# Patient Record
Sex: Female | Born: 1975 | Race: White | Hispanic: No | Smoking: Former smoker
Health system: Southern US, Community
[De-identification: ages and names within clinical notes are randomized; demographics above are authoritative.]

## PROBLEM LIST (undated history)

## (undated) DIAGNOSIS — E282 Polycystic ovarian syndrome: Secondary | ICD-10-CM

## (undated) DIAGNOSIS — G510 Bell's palsy: Secondary | ICD-10-CM

## (undated) DIAGNOSIS — F419 Anxiety disorder, unspecified: Secondary | ICD-10-CM

## (undated) DIAGNOSIS — E669 Obesity, unspecified: Secondary | ICD-10-CM

## (undated) DIAGNOSIS — E119 Type 2 diabetes mellitus without complications: Secondary | ICD-10-CM

## (undated) DIAGNOSIS — I10 Essential (primary) hypertension: Secondary | ICD-10-CM

## (undated) DIAGNOSIS — D649 Anemia, unspecified: Secondary | ICD-10-CM

## (undated) DIAGNOSIS — K219 Gastro-esophageal reflux disease without esophagitis: Secondary | ICD-10-CM

## (undated) HISTORY — DX: Anemia, unspecified: D64.9

## (undated) HISTORY — DX: Essential (primary) hypertension: I10

## (undated) HISTORY — DX: Bell's palsy: G51.0

## (undated) HISTORY — DX: Anxiety disorder, unspecified: F41.9

## (undated) HISTORY — DX: Hypercalcemia: E83.52

## (undated) HISTORY — DX: Gastro-esophageal reflux disease without esophagitis: K21.9

## (undated) HISTORY — PX: UPPER GASTROINTESTINAL ENDOSCOPY: SHX188

## (undated) HISTORY — DX: Obesity, unspecified: E66.9

## (undated) HISTORY — PX: MOLE REMOVAL: SHX2046

---

## 2013-11-19 DIAGNOSIS — E282 Polycystic ovarian syndrome: Secondary | ICD-10-CM | POA: Insufficient documentation

## 2013-11-19 DIAGNOSIS — K219 Gastro-esophageal reflux disease without esophagitis: Secondary | ICD-10-CM | POA: Insufficient documentation

## 2013-11-19 DIAGNOSIS — L680 Hirsutism: Secondary | ICD-10-CM | POA: Insufficient documentation

## 2013-11-19 DIAGNOSIS — G4700 Insomnia, unspecified: Secondary | ICD-10-CM | POA: Insufficient documentation

## 2013-12-30 DIAGNOSIS — I10 Essential (primary) hypertension: Secondary | ICD-10-CM | POA: Insufficient documentation

## 2013-12-30 DIAGNOSIS — F411 Generalized anxiety disorder: Secondary | ICD-10-CM | POA: Insufficient documentation

## 2014-08-15 DIAGNOSIS — G4733 Obstructive sleep apnea (adult) (pediatric): Secondary | ICD-10-CM | POA: Insufficient documentation

## 2014-09-09 ENCOUNTER — Other Ambulatory Visit (HOSPITAL_COMMUNITY): Payer: Self-pay | Admitting: Gastroenterology

## 2014-09-09 DIAGNOSIS — R6881 Early satiety: Secondary | ICD-10-CM

## 2014-09-19 ENCOUNTER — Ambulatory Visit (HOSPITAL_COMMUNITY)
Admission: RE | Admit: 2014-09-19 | Discharge: 2014-09-19 | Disposition: A | Discharge status: Home / Self Care | Payer: BC Managed Care – PPO | Source: Ambulatory Visit | Attending: Gastroenterology | Admitting: Gastroenterology

## 2014-09-19 DIAGNOSIS — R6881 Early satiety: Secondary | ICD-10-CM | POA: Diagnosis present

## 2014-09-19 DIAGNOSIS — K219 Gastro-esophageal reflux disease without esophagitis: Secondary | ICD-10-CM | POA: Insufficient documentation

## 2014-09-19 DIAGNOSIS — R112 Nausea with vomiting, unspecified: Secondary | ICD-10-CM | POA: Diagnosis present

## 2014-09-19 MED ORDER — TECHNETIUM TC 99M SULFUR COLLOID
2.0000 | Freq: Once | INTRAVENOUS | Status: AC | PRN
  Administered 2014-09-19: 2 via INTRAVENOUS

## 2015-03-19 DIAGNOSIS — E1169 Type 2 diabetes mellitus with other specified complication: Secondary | ICD-10-CM | POA: Insufficient documentation

## 2015-03-19 DIAGNOSIS — E785 Hyperlipidemia, unspecified: Secondary | ICD-10-CM | POA: Insufficient documentation

## 2016-11-22 DIAGNOSIS — D729 Disorder of white blood cells, unspecified: Secondary | ICD-10-CM | POA: Insufficient documentation

## 2016-11-22 DIAGNOSIS — R809 Proteinuria, unspecified: Secondary | ICD-10-CM | POA: Insufficient documentation

## 2016-11-22 DIAGNOSIS — D75839 Thrombocytosis, unspecified: Secondary | ICD-10-CM | POA: Insufficient documentation

## 2016-11-22 DIAGNOSIS — D72829 Elevated white blood cell count, unspecified: Secondary | ICD-10-CM | POA: Insufficient documentation

## 2016-12-28 DIAGNOSIS — F17200 Nicotine dependence, unspecified, uncomplicated: Secondary | ICD-10-CM | POA: Insufficient documentation

## 2018-01-22 DIAGNOSIS — E538 Deficiency of other specified B group vitamins: Secondary | ICD-10-CM | POA: Insufficient documentation

## 2018-01-22 DIAGNOSIS — E559 Vitamin D deficiency, unspecified: Secondary | ICD-10-CM | POA: Insufficient documentation

## 2018-09-14 DIAGNOSIS — Z2821 Immunization not carried out because of patient refusal: Secondary | ICD-10-CM | POA: Insufficient documentation

## 2018-09-14 DIAGNOSIS — M62838 Other muscle spasm: Secondary | ICD-10-CM | POA: Insufficient documentation

## 2020-03-18 DIAGNOSIS — Z862 Personal history of diseases of the blood and blood-forming organs and certain disorders involving the immune mechanism: Secondary | ICD-10-CM | POA: Insufficient documentation

## 2021-07-08 ENCOUNTER — Other Ambulatory Visit: Payer: Self-pay

## 2021-07-08 ENCOUNTER — Emergency Department (INDEPENDENT_AMBULATORY_CARE_PROVIDER_SITE_OTHER)
Admission: RE | Admit: 2021-07-08 | Discharge: 2021-07-08 | Disposition: A | Discharge status: Home / Self Care | Payer: BC Managed Care – PPO | Source: Ambulatory Visit

## 2021-07-08 ENCOUNTER — Emergency Department (INDEPENDENT_AMBULATORY_CARE_PROVIDER_SITE_OTHER): Payer: BC Managed Care – PPO

## 2021-07-08 VITALS — BP 182/110 | HR 91 | Temp 98.8°F | Resp 16

## 2021-07-08 DIAGNOSIS — R1030 Lower abdominal pain, unspecified: Secondary | ICD-10-CM

## 2021-07-08 DIAGNOSIS — I10 Essential (primary) hypertension: Secondary | ICD-10-CM | POA: Diagnosis not present

## 2021-07-08 HISTORY — DX: Type 2 diabetes mellitus without complications: E11.9

## 2021-07-08 HISTORY — DX: Polycystic ovarian syndrome: E28.2

## 2021-07-08 NOTE — ED Provider Notes (Signed)
[facility]    CSN: [id number] Arrival date & time: 07/08/21  1230      History   Chief Complaint Chief Complaint  Patient presents with   Abdominal Pain    HPI Lindsey Maxwell is a 45 y.o. female.   HPI 45 year old female female patient presents with abdominal pain since this morning.  Reports abdominal pain lasted 10 minutes after having small bowel movement and was located bilaterally below belly button however this pain is now resolved.  Reports has been diagnosed with hypertension in the past and is currently not followed by PCP.  Past Medical History:  Diagnosis Date   Diabetes mellitus without complication (HCC)    PCOS (polycystic ovarian syndrome)     There are no problems to display for this patient.   History reviewed. No pertinent surgical history.  OB History   No obstetric history on file.      Home Medications    Prior to Admission medications   Medication Sig Start Date End Date Taking? Authorizing Provider  esomeprazole (NEXIUM) 20 MG capsule Take 20 mg by mouth daily at 12 noon.   Yes [provider]  Norgestimate-Eth Estradiol (SPRINTEC 28 PO) Take by mouth.   Yes [provider]    Family History Family History  Problem Relation Age of Onset   Cancer Mother        breast CA   Diabetes Mother    Hypertension Father    Heart attack Brother 33    Social History Social History   Tobacco Use   Smoking status: Former    Types: Cigarettes    Quit date: 06/22/2011    Years since quitting: 10.0  Vaping Use   Vaping Use: Never used  Substance Use Topics   Alcohol use: Yes   Drug use: Never     Allergies   Patient has no known allergies.   Review of Systems Review of Systems  Gastrointestinal:  Positive for abdominal pain.  All other systems reviewed and are negative.  Second manual BP left seated 182/110  Physical Exam Triage Vital Signs ED Triage Vitals  Enc Vitals Group     BP 07/08/21 1302 (!)  204/108     Pulse Rate 07/08/21 1302 91     Resp 07/08/21 1302 16     Temp 07/08/21 1302 98.8 F (37.1 C)     Temp Source 07/08/21 1302 Oral     SpO2 07/08/21 1302 97 %     Weight --      Height --      Head Circumference --      Peak Flow --      Pain Score 07/08/21 1305 0     Pain Loc --      Pain Edu? --      Excl. in [place]? --    No data found.  Updated Vital Signs BP (!) 182/110 (BP Location: Left Arm)   Pulse 91   Temp 98.8 F (37.1 C) (Oral)   Resp 16   SpO2 97%   Physical Exam Vitals and nursing note reviewed.  Constitutional:      General: She is not in acute distress.    Appearance: Normal appearance. She is obese. She is not ill-appearing.  HENT:     Head: Normocephalic and atraumatic.     Mouth/Throat:     Mouth: Mucous membranes are moist.     Pharynx: Oropharynx is clear.  Eyes:     Extraocular  Movements: Extraocular movements intact.     Conjunctiva/sclera: Conjunctivae normal.     Pupils: Pupils are equal, round, and reactive to light.  Cardiovascular:     Rate and Rhythm: Normal rate and regular rhythm.     Pulses: Normal pulses.     Heart sounds: Normal heart sounds. No murmur heard. Pulmonary:     Effort: Pulmonary effort is normal.     Breath sounds: Normal breath sounds. No wheezing, rhonchi or rales.  Abdominal:     General: There is no distension.     Palpations: Abdomen is soft. There is no mass.     Tenderness: There is no abdominal tenderness. There is no right CVA tenderness, left CVA tenderness, guarding or rebound.     Hernia: No hernia is present.     Comments: Hypoactive bowel sounds x4 quads, no hepatosplenomegaly  Musculoskeletal:        General: Normal range of motion.     Cervical back: Normal range of motion and neck supple. No tenderness.  Lymphadenopathy:     Cervical: No cervical adenopathy.  Skin:    General: Skin is warm and dry.  Neurological:     General: No focal deficit present.     Mental Status: She is alert and  oriented to person, place, and time. Mental status is at baseline.  Psychiatric:        Mood and Affect: Mood normal.        Behavior: Behavior normal.        Thought Content: Thought content normal.     UC Treatments / Results  Labs (all labs ordered are listed, but only abnormal results are displayed) Labs Reviewed  CBC WITH DIFFERENTIAL/PLATELET  COMPLETE METABOLIC PANEL WITH GFR    EKG   Radiology DG Abdomen 1 View  Result Date: 07/08/2021 CLINICAL DATA:  Lower abdominal pain EXAM: ABDOMEN - 1 VIEW COMPARISON:  None. FINDINGS: The bowel gas pattern is normal. No radio-opaque calculi or other significant radiographic abnormality are seen. IMPRESSION: Negative. Electronically Signed   By: Franchot Gallo M.D.   On: 07/08/2021 14:52    Procedures Procedures (including critical care time)  Medications Ordered in UC Medications - No data to display  Initial Impression / Assessment and Plan / UC Course  I have reviewed the triage vital signs and the nursing notes.  Pertinent labs & imaging results that were available during my care of the patient were reviewed by me and considered in my medical decision making (see chart for details).     MDM: 1.  Lower abdominal pain-KUB revealed no acute process, CBC with differential ordered; 2.  Hypertension-CMP ordered. Advised/instructed patient to take blood pressure first thing in the morning after voiding and prior to breakfast.  Advised patient to write numbers down for the next 7 to 10 days so that we or new PCP can evaluate daily blood pressure trends.  Advised patient we will follow-up with lab results once received.  Patient discharged home, hemodynamically stable.  Final Clinical Impressions(s) / UC Diagnoses   Final diagnoses:  Lower abdominal pain  Essential hypertension     Discharge Instructions      Advised/instructed patient to take blood pressure first thing in the morning after voiding and prior to breakfast.   Advised patient to write numbers down for the next 7 to 10 days so that we or new PCP can evaluate daily blood pressure trends.  Advised patient we will follow-up with lab results once received.  ED Prescriptions   None    PDMP not reviewed this encounter.   Eliezer Lofts, [place] 07/08/21 [phone]

## 2021-07-08 NOTE — Discharge Instructions (Addendum)
Advised/instructed patient to take blood pressure first thing in the morning after voiding and prior to breakfast.  Advised patient to write numbers down for the next 7 to 10 days so that we or new PCP can evaluate daily blood pressure trends.  Advised patient we will follow-up with lab results once received.

## 2021-07-08 NOTE — ED Triage Notes (Signed)
Patient c/o an episode of abdominal pain that lasted about 10 minutes after having a small bowel movement. Pain was lower and bilateral. Reports sweats and chills at the time. Pain has since resolved.

## 2021-07-09 ENCOUNTER — Telehealth: Payer: Self-pay | Admitting: Emergency Medicine

## 2021-07-09 LAB — CBC WITH DIFFERENTIAL/PLATELET
Absolute Monocytes: 475 cells/uL (ref 200–950)
Basophils Absolute: 75 cells/uL (ref 0–200)
Basophils Relative: 0.6 %
Eosinophils Absolute: 263 cells/uL (ref 15–500)
Eosinophils Relative: 2.1 %
HCT: 35.7 % (ref 35.0–45.0)
Hemoglobin: 11.3 g/dL — ABNORMAL LOW (ref 11.7–15.5)
Lymphs Abs: 1950 cells/uL (ref 850–3900)
MCH: 24.5 pg — ABNORMAL LOW (ref 27.0–33.0)
MCHC: 31.7 g/dL — ABNORMAL LOW (ref 32.0–36.0)
MCV: 77.3 fL — ABNORMAL LOW (ref 80.0–100.0)
MPV: 11 fL (ref 7.5–12.5)
Monocytes Relative: 3.8 %
Neutro Abs: 9738 cells/uL — ABNORMAL HIGH (ref 1500–7800)
Neutrophils Relative %: 77.9 %
Platelets: 502 10*3/uL — ABNORMAL HIGH (ref 140–400)
RBC: 4.62 10*6/uL (ref 3.80–5.10)
RDW: 14.4 % (ref 11.0–15.0)
Total Lymphocyte: 15.6 %
WBC: 12.5 10*3/uL — ABNORMAL HIGH (ref 3.8–10.8)

## 2021-07-09 LAB — COMPLETE METABOLIC PANEL WITH GFR
AG Ratio: 1.2 (calc) (ref 1.0–2.5)
ALT: 16 U/L (ref 6–29)
AST: 33 U/L — ABNORMAL HIGH (ref 10–30)
Albumin: 4.1 g/dL (ref 3.6–5.1)
Alkaline phosphatase (APISO): 130 U/L — ABNORMAL HIGH (ref 31–125)
BUN: 10 mg/dL (ref 7–25)
CO2: 25 mmol/L (ref 20–32)
Calcium: 9.4 mg/dL (ref 8.6–10.2)
Chloride: 94 mmol/L — ABNORMAL LOW (ref 98–110)
Creat: 0.74 mg/dL (ref 0.50–0.99)
Globulin: 3.5 g/dL (calc) (ref 1.9–3.7)
Glucose, Bld: 270 mg/dL — ABNORMAL HIGH (ref 65–99)
Potassium: 4.4 mmol/L (ref 3.5–5.3)
Sodium: 133 mmol/L — ABNORMAL LOW (ref 135–146)
Total Bilirubin: 0.3 mg/dL (ref 0.2–1.2)
Total Protein: 7.6 g/dL (ref 6.1–8.1)
eGFR: 103 mL/min/{1.73_m2} (ref >=60)

## 2021-07-09 MED ORDER — LOSARTAN POTASSIUM 50 MG PO TABS: 50.0000 mg | ORAL_TABLET | Freq: Every day | ORAL | 0 refills | Status: DC

## 2021-07-09 NOTE — Telephone Encounter (Signed)
Call to [facility] to update her on labs and to  let her know Dr Assunta Found was reviewing her chart. BP this morning was 192/104 on the left arm. Sarra states she is not on any BP medicine & is currently not taking anything for her diabetes. Pt states she was on diltiazem in the past for BP. Pt denies chest or abdominal pain. RN will call Jeanifer back later on today.

## 2021-07-09 NOTE — Telephone Encounter (Signed)
Call back to check on pharmacy for [facility] - pt does not have a PCP. Denies any headache at this time. Plan per Dr Assunta Found is for pt to start on losartan today, check BP daily, keep a record of BP & blood sugar at home & establish care with a PCP today. Lindsey Maxwell verbalized an understanding. Pharmacy confirmed. Lindsey Maxwell also verbalized an understanding that she should go to the ER immediately for severe abdominal pain, chest pain or headache that does not resolve or uncontrolled nausea and vomiting. RN also reviewed symptoms of an elevated blood sugar that would require Lindsey Maxwell to go to the ED: increased thirst, nocturia & increase in urination. Lindsey Maxwell verbalized an understanding.

## 2021-07-09 NOTE — Telephone Encounter (Signed)
Patient reports that her abdominal pain has resolved, but her BP remains elevated at home 192/104.  Note mildly elevated LFT's and mild leukocytosis (WBC 12.5). Will begin Losartan 50mg  daily (#30, no refill).  Recommend that she monitor BP and follow-up with PCP as soon as possible for further evaluation (presently does not have PCP). If symptoms become significantly worse during the night or over the weekend, advised to proceed to the local emergency room.

## 2021-08-16 ENCOUNTER — Ambulatory Visit (INDEPENDENT_AMBULATORY_CARE_PROVIDER_SITE_OTHER): Payer: BC Managed Care – PPO | Admitting: Medical-Surgical

## 2021-08-16 ENCOUNTER — Other Ambulatory Visit: Payer: Self-pay

## 2021-08-16 ENCOUNTER — Encounter: Payer: Self-pay | Admitting: Medical-Surgical

## 2021-08-16 VITALS — BP 166/89 | HR 86 | Temp 98.3°F | Ht 64.0 in | Wt 232.3 lb

## 2021-08-16 DIAGNOSIS — Z13220 Encounter for screening for lipoid disorders: Secondary | ICD-10-CM

## 2021-08-16 DIAGNOSIS — R300 Dysuria: Secondary | ICD-10-CM | POA: Diagnosis not present

## 2021-08-16 DIAGNOSIS — Z0000 Encounter for general adult medical examination without abnormal findings: Secondary | ICD-10-CM

## 2021-08-16 DIAGNOSIS — E1165 Type 2 diabetes mellitus with hyperglycemia: Secondary | ICD-10-CM

## 2021-08-16 DIAGNOSIS — E559 Vitamin D deficiency, unspecified: Secondary | ICD-10-CM

## 2021-08-16 DIAGNOSIS — E1169 Type 2 diabetes mellitus with other specified complication: Secondary | ICD-10-CM | POA: Diagnosis not present

## 2021-08-16 DIAGNOSIS — I10 Essential (primary) hypertension: Secondary | ICD-10-CM | POA: Diagnosis not present

## 2021-08-16 DIAGNOSIS — Z114 Encounter for screening for human immunodeficiency virus [HIV]: Secondary | ICD-10-CM

## 2021-08-16 DIAGNOSIS — E538 Deficiency of other specified B group vitamins: Secondary | ICD-10-CM

## 2021-08-16 DIAGNOSIS — Z8639 Personal history of other endocrine, nutritional and metabolic disease: Secondary | ICD-10-CM

## 2021-08-16 DIAGNOSIS — Z7689 Persons encountering health services in other specified circumstances: Secondary | ICD-10-CM | POA: Diagnosis not present

## 2021-08-16 DIAGNOSIS — E785 Hyperlipidemia, unspecified: Secondary | ICD-10-CM

## 2021-08-16 DIAGNOSIS — Z1329 Encounter for screening for other suspected endocrine disorder: Secondary | ICD-10-CM

## 2021-08-16 DIAGNOSIS — D509 Iron deficiency anemia, unspecified: Secondary | ICD-10-CM

## 2021-08-16 DIAGNOSIS — Z1159 Encounter for screening for other viral diseases: Secondary | ICD-10-CM

## 2021-08-16 LAB — POCT URINALYSIS DIP (CLINITEK)
Bilirubin, UA: NEGATIVE
Blood, UA: NEGATIVE
Glucose, UA: 250 mg/dL — AB
Nitrite, UA: NEGATIVE
Spec Grav, UA: 1.025 (ref 1.010–1.025)
Urobilinogen, UA: 0.2 E.U./dL
pH, UA: 5.5 (ref 5.0–8.0)

## 2021-08-16 LAB — POCT GLYCOSYLATED HEMOGLOBIN (HGB A1C): Hemoglobin A1C: 11 % — AB (ref 4.0–5.6)

## 2021-08-16 MED ORDER — NORGESTIMATE-ETH ESTRADIOL 0.25-35 MG-MCG PO TABS: 1.0000 | ORAL_TABLET | Freq: Every day | ORAL | 0 refills | Status: DC

## 2021-08-16 MED ORDER — ROSUVASTATIN CALCIUM 10 MG PO TABS: 10.0000 mg | ORAL_TABLET | Freq: Every day | ORAL | 3 refills | Status: DC

## 2021-08-16 MED ORDER — LOSARTAN POTASSIUM-HCTZ 100-12.5 MG PO TABS: 1.0000 | ORAL_TABLET | Freq: Every day | ORAL | 1 refills | Status: DC

## 2021-08-16 MED ORDER — ESOMEPRAZOLE MAGNESIUM 20 MG PO CPDR: 20.0000 mg | DELAYED_RELEASE_CAPSULE | Freq: Every day | ORAL | 1 refills | Status: DC

## 2021-08-16 MED ORDER — METFORMIN HCL ER 500 MG PO TB24: 500.0000 mg | ORAL_TABLET | Freq: Two times a day (BID) | ORAL | 0 refills | Status: DC

## 2021-08-16 NOTE — Progress Notes (Signed)
New Patient Office Visit  Subjective:  Patient ID: Lindsey Maxwell, female    DOB: Nov 27, 1975  Age: 45 y.o. MRN: [id number]  CC:  Chief Complaint  Patient presents with   Establish Care   Dysuria    Onset 3 days ago   Hypertension   Diabetes     HPI Lindsey Maxwell presents to establish care.  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  Diabetes-has recently been checking her sugars with a range of 198-389 fasting.  Taking metformin 500 mg daily, tolerating well although she did have a couple of weeks of GI upset when she for started.   Hypertension-was recently seen at urgent care and restarted on losartan 50 mg daily.  Taking medication as prescribed, tolerating well without side effects.  She does have this listed as an allergy however she is currently tolerating her dosing without difficulty.  Checking her blood pressures at home with ranges of 152-225/83-114.  Notes that she recently purchased a new blood pressure cuff that measures on the arm.  Following a low-sodium diet.  Not currently exercising.  Mood-does have a history of some situational anxiety.  Uses Xanax as needed but only takes a half tab.  She still has the same bottle that was prescribed in June 2021 and only uses this for severe anxiety very sparingly.  Would prefer not to be on a controller medicine and does not like to feel drugged up so does not use this very often.  GERD-taking omeprazole 1 to 2 capsules nightly.  Notes that she usually takes 1 but if she is already having issues with reflux throughout the day, she will go ahead and take 2 to prevent waking up with nausea.  Vitamin D deficiency-history of vitamin D deficiency and has not been checked in a couple of years.  Not currently taking supplementation.  Vitamin B12 deficiency-striae vitamin B-12 deficiency but not currently taking  supplementation.  History of iron deficiency-has not been checked in a couple of years.  On oral birth control to prevent periods due to PCOS.  Dysuria-started about 3 days ago with intermittent burning with urination.  Does not happen with each void but has started to get more frequent.  No hematuria, urgency, frequency, hesitancy, or foul odor noted.  Past Medical History:  Diagnosis Date   Diabetes mellitus without complication (HCC)    PCOS (polycystic ovarian syndrome)     History reviewed. No pertinent surgical history.  Family History  Problem Relation Age of Onset   Cancer Mother        breast CA   Diabetes Mother    Hypertension Father    Heart attack Brother 68    Social History   Socioeconomic History   Marital status: Divorced    Spouse name: Not on file   Number of children: Not on file   Years of education: Not on file   Highest education level: Not on file  Occupational History   Not on file  Tobacco Use   Smoking status: Former    Types: Cigarettes    Quit date: 06/22/2011    Years since quitting: 10.1   Smokeless tobacco: Never  Vaping Use   Vaping Use: Never used  Substance and Sexual Activity   Alcohol use: Yes    Comment: occasionally   Drug use: Never   Sexual activity: Yes  Birth control/protection: OCP  Other Topics Concern   Not on file  Social History Narrative   Not on file   Social Determinants of Health   Financial Resource Strain: Not on file  Food Insecurity: Not on file  Transportation Needs: Not on file  Physical Activity: Not on file  Stress: Not on file  Social Connections: Not on file  Intimate Partner Violence: Not on file    ROS Review of Systems  Constitutional:  Negative for chills, fatigue, fever and unexpected weight change.  HENT:  Negative for congestion, rhinorrhea, sinus pressure and sore throat.   Respiratory:  Negative for cough, chest tightness and shortness of breath.   Cardiovascular:  Negative for  chest pain, palpitations and leg swelling.  Gastrointestinal:  Negative for abdominal pain, constipation, diarrhea, nausea and vomiting.  Endocrine: Negative for cold intolerance and heat intolerance.  Genitourinary:  Positive for dysuria. Negative for frequency and urgency.  Skin:  Negative for rash and wound.  Neurological:  Positive for numbness (Occasional tingling in fingers). Negative for dizziness, light-headedness and headaches.  Hematological:  Does not bruise/bleed easily.  Psychiatric/Behavioral:  Negative for dysphoric mood, self-injury, sleep disturbance and suicidal ideas. The patient is nervous/anxious.    Objective:   Today's Vitals: BP (!) 166/89   Pulse 86   Temp 98.3 F (36.8 C)   Ht 5\' 4"  (1.626 m)   Wt 232 lb 4.8 oz (105.4 kg)   SpO2 98%   BMI 39.87 kg/m   Physical Exam Vitals and nursing note reviewed.  Constitutional:      General: She is not in acute distress.    Appearance: Normal appearance.  HENT:     Head: Normocephalic and atraumatic.  Cardiovascular:     Rate and Rhythm: Normal rate and regular rhythm.     Pulses: Normal pulses.     Heart sounds: Normal heart sounds. No murmur heard.   No friction rub. No gallop.  Pulmonary:     Effort: Pulmonary effort is normal. No respiratory distress.     Breath sounds: Normal breath sounds. No wheezing.  Skin:    General: Skin is warm and dry.  Neurological:     Mental Status: She is alert and oriented to person, place, and time.  Psychiatric:        Mood and Affect: Mood normal.        Behavior: Behavior normal.        Thought Content: Thought content normal.        Judgment: Judgment normal.    Assessment & Plan:   1. Encounter to establish care Reviewed available information and discussed care concerns with patient.   2. Uncontrolled type 2 diabetes mellitus with hyperglycemia, without long-term current use of insulin (HCC) POCT hemoglobin A1c 11%.  Checking CBC with differential, CMP, and  lipid panel today.  Increasing metformin to 500 mg XR twice daily with meals.  Starting Rybelsus 3 mg daily, 30 minutes before meal.  Recommend checking glucose at least 3 times weekly fasting with a goal of 130 or less. - CBC with Differential/Platelet - COMPLETE METABOLIC PANEL WITH GFR - Lipid panel - POCT HgB A1C  3. Hyperlipidemia associated with type 2 diabetes mellitus ([place]) Checking lipid panel today. - Lipid panel  4. Essential hypertension Blood pressure still not controlled.  Increasing losartan to 100 mg daily and adding hydrochlorothiazide 12.5 mg daily.  Recommend checking blood pressure at home with goal of 130/80 or less.  Advised patient to bring  her blood pressure monitor with her for a nurse visit in 2 weeks so we can verify accuracy.  Encourage low-sodium diet.  5. Hypochromic-microcytic anemia Adding pathology smear review to CBC. - Pathologist smear review  6. Vitamin D deficiency Checking vitamin D. - Vitamin D 1,25 dihydroxy  7. Vitamin B 12 deficiency Checking vitamin B12. - Vitamin B12  8. Screening for endocrine disorder Checking TSH. - TSH  9. History of iron deficiency Checking iron panel. - Fe+TIBC+Fer  10. Dysuria POCT urinalysis positive for small leukocytes, trace ketones, trace protein, and glucose.  Sending for culture.  We will hold off on treatment due to minimal symptoms at this point. - Urine Culture - POCT URINALYSIS DIP (CLINITEK)  11. Need for hepatitis C screening test 12. Screening for HIV (human immunodeficiency virus) Discussed many recommendations.  Patient is agreeable so adding to blood work today. - Hepatitis C antibody - HIV Antibody (routine testing w rflx)    Outpatient Encounter Medications as of 08/16/2021  Medication Sig   ALPRAZolam (XANAX) 0.5 MG tablet Take 0.5 mg by mouth daily as needed for anxiety.   esomeprazole (NEXIUM) 20 MG capsule Take 1 capsule (20 mg total) by mouth daily at 12 noon.    losartan-hydrochlorothiazide (HYZAAR) 100-12.5 MG tablet Take 1 tablet by mouth daily.   meloxicam (MOBIC) 15 MG tablet Take 15 mg by mouth daily as needed for pain.   metFORMIN (GLUCOPHAGE XR) 500 MG 24 hr tablet Take 1 tablet (500 mg total) by mouth 2 (two) times daily before a meal.   norgestimate-ethinyl estradiol (SPRINTEC 28) 0.25-35 MG-MCG tablet Take 1 tablet by mouth daily.   norgestrel-ethinyl estradiol (LO/OVRAL) 0.3-30 MG-MCG tablet Take 1 tablet by mouth daily.   rosuvastatin (CRESTOR) 10 MG tablet Take 1 tablet (10 mg total) by mouth daily.   [DISCONTINUED] esomeprazole (NEXIUM) 20 MG capsule Take 20 mg by mouth daily at 12 noon.   [DISCONTINUED] losartan (COZAAR) 50 MG tablet Take 1 tablet (50 mg total) by mouth daily.   [DISCONTINUED] Norgestimate-Eth Estradiol (SPRINTEC 28 PO) Take by mouth.   No facility-administered encounter medications on file as of 08/16/2021.    Follow-up: Return in about 2 weeks (around 08/30/2021) for nurse visit for BP check.   Clearnce Sorrel, DNP, APRN, FNP-BC [facility]

## 2021-08-16 NOTE — Patient Instructions (Signed)
Blood pressure- changing to Losartan with HCTZ 100-12.5mg  once daily  Diabetes- increase Metformin to twice daily (breakfast and dinner). Adding Rybelsus 3mg  daily, 30 minutes before a meal. Check sugars at least 3 times weekly fasting.   Cholesterol- Starting Crestor 10mg  daily.

## 2021-08-17 LAB — HIV ANTIBODY (ROUTINE TESTING W REFLEX): HIV 1&2 Ab, 4th Generation: NONREACTIVE

## 2021-08-17 LAB — HEPATITIS C ANTIBODY
Hepatitis C Ab: NONREACTIVE
SIGNAL TO CUT-OFF: 0.06 (ref <1.00)

## 2021-08-18 LAB — URINE CULTURE
MICRO NUMBER:: 12605292
SPECIMEN QUALITY:: ADEQUATE

## 2021-08-18 MED ORDER — CEPHALEXIN 500 MG PO CAPS: 500.0000 mg | ORAL_CAPSULE | Freq: Two times a day (BID) | ORAL | 0 refills | Status: DC

## 2021-08-18 NOTE — Addendum Note (Signed)
Addended bySamuel Bouche on: 08/18/2021 07:07 AM   Modules accepted: Orders

## 2021-08-19 LAB — CBC WITH DIFFERENTIAL/PLATELET
Absolute Monocytes: 402 cells/uL (ref 200–950)
Basophils Absolute: 62 cells/uL (ref 0–200)
Basophils Relative: 0.6 %
Eosinophils Absolute: 319 cells/uL (ref 15–500)
Eosinophils Relative: 3.1 %
HCT: 37.7 % (ref 35.0–45.0)
Hemoglobin: 11.8 g/dL (ref 11.7–15.5)
Lymphs Abs: 2081 cells/uL (ref 850–3900)
MCH: 24.5 pg — ABNORMAL LOW (ref 27.0–33.0)
MCHC: 31.3 g/dL — ABNORMAL LOW (ref 32.0–36.0)
MCV: 78.4 fL — ABNORMAL LOW (ref 80.0–100.0)
MPV: 10.4 fL (ref 7.5–12.5)
Monocytes Relative: 3.9 %
Neutro Abs: 7437 cells/uL (ref 1500–7800)
Neutrophils Relative %: 72.2 %
Platelets: 406 10*3/uL — ABNORMAL HIGH (ref 140–400)
RBC: 4.81 10*6/uL (ref 3.80–5.10)
RDW: 15.6 % — ABNORMAL HIGH (ref 11.0–15.0)
Total Lymphocyte: 20.2 %
WBC: 10.3 10*3/uL (ref 3.8–10.8)

## 2021-08-19 LAB — VITAMIN D 1,25 DIHYDROXY
Vitamin D 1, 25 (OH)2 Total: 36 pg/mL (ref 18–72)
Vitamin D2 1, 25 (OH)2: <8 pg/mL
Vitamin D3 1, 25 (OH)2: 36 pg/mL

## 2021-08-19 LAB — COMPLETE METABOLIC PANEL WITH GFR
AG Ratio: 1.2 (calc) (ref 1.0–2.5)
ALT: 33 U/L — ABNORMAL HIGH (ref 6–29)
AST: 63 U/L — ABNORMAL HIGH (ref 10–30)
Albumin: 4.2 g/dL (ref 3.6–5.1)
Alkaline phosphatase (APISO): 132 U/L — ABNORMAL HIGH (ref 31–125)
BUN: 11 mg/dL (ref 7–25)
CO2: 26 mmol/L (ref 20–32)
Calcium: 9.3 mg/dL (ref 8.6–10.2)
Chloride: 96 mmol/L — ABNORMAL LOW (ref 98–110)
Creat: 0.57 mg/dL (ref 0.50–0.99)
Globulin: 3.5 g/dL (calc) (ref 1.9–3.7)
Glucose, Bld: 211 mg/dL — ABNORMAL HIGH (ref 65–99)
Potassium: 4.5 mmol/L (ref 3.5–5.3)
Sodium: 134 mmol/L — ABNORMAL LOW (ref 135–146)
Total Bilirubin: 0.3 mg/dL (ref 0.2–1.2)
Total Protein: 7.7 g/dL (ref 6.1–8.1)
eGFR: 116 mL/min/{1.73_m2} (ref >=60)

## 2021-08-19 LAB — LIPID PANEL
Cholesterol: 274 mg/dL — ABNORMAL HIGH (ref <200)
HDL: 31 mg/dL — ABNORMAL LOW (ref >=50)
LDL Cholesterol (Calc): 192 mg/dL (calc) — ABNORMAL HIGH
Non-HDL Cholesterol (Calc): 243 mg/dL (calc) — ABNORMAL HIGH (ref <130)
Total CHOL/HDL Ratio: 8.8 (calc) — ABNORMAL HIGH (ref <5.0)
Triglycerides: 301 mg/dL — ABNORMAL HIGH (ref <150)

## 2021-08-19 LAB — IRON,TIBC AND FERRITIN PANEL
%SAT: 7 % (calc) — ABNORMAL LOW (ref 16–45)
Ferritin: 27 ng/mL (ref 16–232)
Iron: 36 ug/dL — ABNORMAL LOW (ref 40–190)
TIBC: 525 mcg/dL (calc) — ABNORMAL HIGH (ref 250–450)

## 2021-08-19 LAB — PATHOLOGIST SMEAR REVIEW

## 2021-08-19 LAB — VITAMIN B12: Vitamin B-12: 1154 pg/mL — ABNORMAL HIGH (ref 200–1100)

## 2021-08-19 LAB — TSH: TSH: 3.92 mIU/L

## 2021-08-31 ENCOUNTER — Other Ambulatory Visit: Payer: Self-pay

## 2021-08-31 ENCOUNTER — Ambulatory Visit (INDEPENDENT_AMBULATORY_CARE_PROVIDER_SITE_OTHER): Payer: BC Managed Care – PPO | Admitting: Medical-Surgical

## 2021-08-31 VITALS — BP 136/64 | HR 80

## 2021-08-31 DIAGNOSIS — I10 Essential (primary) hypertension: Secondary | ICD-10-CM | POA: Diagnosis not present

## 2021-08-31 NOTE — Progress Notes (Signed)
New Patient Office Visit  Subjective:  Patient ID: Lindsey Maxwell, female    DOB: 04-07-1976  Age: 45 y.o. MRN: [id number]  CC:  Chief Complaint  Patient presents with   Hypertension    HPI Drenda Sobecki presents for blood pressure check. She did start the increase of Losartan-HCTZ. Denies chest pains, shortness of breath or dizziness.   Past Medical History:  Diagnosis Date   Diabetes mellitus without complication (HCC)    PCOS (polycystic ovarian syndrome)     History reviewed. No pertinent surgical history.  Family History  Problem Relation Age of Onset   Cancer Mother        breast CA   Diabetes Mother    Hypertension Father    Heart attack Brother 13    Social History   Socioeconomic History   Marital status: Divorced    Spouse name: Not on file   Number of children: Not on file   Years of education: Not on file   Highest education level: Not on file  Occupational History   Not on file  Tobacco Use   Smoking status: Former    Types: Cigarettes    Quit date: 06/22/2011    Years since quitting: 10.2   Smokeless tobacco: Never  Vaping Use   Vaping Use: Never used  Substance and Sexual Activity   Alcohol use: Yes    Comment: occasionally   Drug use: Never   Sexual activity: Yes    Birth control/protection: OCP  Other Topics Concern   Not on file  Social History Narrative   Not on file   Social Determinants of Health   Financial Resource Strain: Not on file  Food Insecurity: Not on file  Transportation Needs: Not on file  Physical Activity: Not on file  Stress: Not on file  Social Connections: Not on file  Intimate Partner Violence: Not on file    ROS Review of Systems  Objective:   Today's Vitals: BP 136/64   Pulse 80   SpO2 98%   Physical Exam  Assessment & Plan:  HTN - Second check of blood pressure was within normal limits. Patient advised to continue current medications as directed. Follow up in 3 months with Joy.    Problem List  Items Addressed This Visit     Essential hypertension - Primary    Outpatient Encounter Medications as of 08/31/2021  Medication Sig   ALPRAZolam (XANAX) 0.5 MG tablet Take 0.5 mg by mouth daily as needed for anxiety.   cephALEXin (KEFLEX) 500 MG capsule Take 1 capsule (500 mg total) by mouth 2 (two) times daily.   esomeprazole (NEXIUM) 20 MG capsule Take 1 capsule (20 mg total) by mouth daily at 12 noon.   losartan-hydrochlorothiazide (HYZAAR) 100-12.5 MG tablet Take 1 tablet by mouth daily.   meloxicam (MOBIC) 15 MG tablet Take 15 mg by mouth daily as needed for pain.   metFORMIN (GLUCOPHAGE XR) 500 MG 24 hr tablet Take 1 tablet (500 mg total) by mouth 2 (two) times daily before a meal.   norgestimate-ethinyl estradiol (SPRINTEC 28) 0.25-35 MG-MCG tablet Take 1 tablet by mouth daily.   norgestrel-ethinyl estradiol (LO/OVRAL) 0.3-30 MG-MCG tablet Take 1 tablet by mouth daily.   rosuvastatin (CRESTOR) 10 MG tablet Take 1 tablet (10 mg total) by mouth daily.   No facility-administered encounter medications on file as of 08/31/2021.    Follow-up: Return in about 3 months (around 12/01/2021) for HTN with [name], NP. Durene Romans,  Monico Blitz, CMA

## 2021-09-07 LAB — HM PAP SMEAR: HM Pap smear: NEGATIVE

## 2021-09-20 ENCOUNTER — Encounter: Payer: Self-pay | Admitting: Medical-Surgical

## 2021-09-23 ENCOUNTER — Other Ambulatory Visit: Payer: Self-pay

## 2021-09-23 ENCOUNTER — Encounter: Payer: Self-pay | Admitting: Family Medicine

## 2021-09-23 ENCOUNTER — Ambulatory Visit (INDEPENDENT_AMBULATORY_CARE_PROVIDER_SITE_OTHER): Payer: BC Managed Care – PPO | Admitting: Family Medicine

## 2021-09-23 VITALS — BP 167/104 | HR 98 | Temp 98.6°F | Ht 65.0 in | Wt 237.0 lb

## 2021-09-23 DIAGNOSIS — Z0000 Encounter for general adult medical examination without abnormal findings: Secondary | ICD-10-CM

## 2021-09-23 DIAGNOSIS — I10 Essential (primary) hypertension: Secondary | ICD-10-CM

## 2021-09-23 NOTE — Progress Notes (Signed)
BP (!) 167/104 (BP Location: Left Arm, Patient Position: Sitting, Cuff Size: Large)    Pulse 98    Temp 98.6 F (37 C) (Oral)    Ht $R'5\' 5"'IX$  (1.651 m)    Wt 237 lb (107.5 kg)    SpO2 97%    BMI 39.44 kg/m    Subjective:    Patient ID: Lindsey Maxwell, female    DOB: 1975/12/16, 45 y.o.   MRN: [id number]  HPI: Lindsey Maxwell is a 45 y.o. female presenting on 09/23/2021 for comprehensive medical examination. Current medical complaints include: none  She currently lives with: alone  Interim Problems from her last visit: no   She reports regular vision exams q1-5y: no; no vision problems  She reports regular dental exams q 69m: no Her diet consists of: regular, trying to try a low carb, Mediterranean diet She endorses exercise and/or activity of: walking  She works at: [employer]   She endorses ETOH use. - 0-3 servings per month She denies nictoine use. She denies illegal substance use.    She reports PCOS - on oral contraceptives.  Current menopausal symptoms: no She is currently  sexually active with  female  partner. She denies  concerns today about STI Contraception choices are: OCP  She denies concerns about skin changes today. She denies concerns about bowel changes today. She denies concerns about bladder changes today.   Depression Screen done today and results listed below:  Depression screen [facility] 2/9 09/23/2021 09/23/2021  Decreased Interest 0 0  Down, Depressed, Hopeless 0 0  PHQ - 2 Score 0 0       She does not have a history of falls.        Past Medical History:  Past Medical History:  Diagnosis Date   Diabetes mellitus without complication (HCC)    PCOS (polycystic ovarian syndrome)     Surgical History:  History reviewed. No pertinent surgical history.  Medications:  Current Outpatient Medications on File Prior to Visit  Medication Sig   ALPRAZolam (XANAX) 0.5 MG tablet Take 0.5 mg by mouth daily as needed for anxiety.   esomeprazole (NEXIUM) 20 MG  capsule Take 1 capsule (20 mg total) by mouth daily at 12 noon.   losartan-hydrochlorothiazide (HYZAAR) 100-12.5 MG tablet Take 1 tablet by mouth daily.   meloxicam (MOBIC) 15 MG tablet Take 15 mg by mouth daily as needed for pain.   metFORMIN (GLUCOPHAGE XR) 500 MG 24 hr tablet Take 1 tablet (500 mg total) by mouth 2 (two) times daily before a meal.   norgestimate-ethinyl estradiol (SPRINTEC 28) 0.25-35 MG-MCG tablet Take 1 tablet by mouth daily.   norgestrel-ethinyl estradiol (LO/OVRAL) 0.3-30 MG-MCG tablet Take 1 tablet by mouth daily.   rosuvastatin (CRESTOR) 10 MG tablet Take 1 tablet (10 mg total) by mouth daily.   cephALEXin (KEFLEX) 500 MG capsule Take 1 capsule (500 mg total) by mouth 2 (two) times daily. (Patient not taking: Reported on 09/23/2021)   No current facility-administered medications on file prior to visit.    Allergies:  Allergies  Allergen Reactions   Sitagliptin Swelling   Amlodipine Nausea And Vomiting    Headaches    Dapagliflozin    Losartan     Dizziness, burning sensation   Metformin Nausea And Vomiting   Metoprolol Nausea And Vomiting    headache   Venlafaxine     Swets, chills, shaky   Atorvastatin Diarrhea   Clonidine Cough    Dry throat  Social History:  Social History   Socioeconomic History   Marital status: Divorced    Spouse name: Not on file   Number of children: Not on file   Years of education: Not on file   Highest education level: Not on file  Occupational History   Not on file  Tobacco Use   Smoking status: Former    Types: Cigarettes    Quit date: 06/22/2011    Years since quitting: 10.2   Smokeless tobacco: Never  Vaping Use   Vaping Use: Never used  Substance and Sexual Activity   Alcohol use: Yes    Comment: occasionally   Drug use: Never   Sexual activity: Yes    Birth control/protection: OCP  Other Topics Concern   Not on file  Social History Narrative   Not on file   Social Determinants of Health    Financial Resource Strain: Not on file  Food Insecurity: Not on file  Transportation Needs: Not on file  Physical Activity: Not on file  Stress: Not on file  Social Connections: Not on file  Intimate Partner Violence: Not on file   Social History   Tobacco Use  Smoking Status Former   Types: Cigarettes   Quit date: 06/22/2011   Years since quitting: 10.2  Smokeless Tobacco Never   Social History   Substance and Sexual Activity  Alcohol Use Yes   Comment: occasionally    Family History:  Family History  Problem Relation Age of Onset   Cancer Mother        breast CA   Diabetes Mother    Hypertension Father    Heart attack Brother 61    Past medical history, surgical history, medications, allergies, family history and social history reviewed with patient today and changes made to appropriate areas of the chart.   All ROS negative except what is listed above and in the HPI.      Objective:    BP (!) 167/104 (BP Location: Left Arm, Patient Position: Sitting, Cuff Size: Large)    Pulse 98    Temp 98.6 F (37 C) (Oral)    Ht $R'5\' 5"'RC$  (1.651 m)    Wt 237 lb (107.5 kg)    SpO2 97%    BMI 39.44 kg/m   Wt Readings from Last 3 Encounters:  09/23/21 237 lb (107.5 kg)  08/16/21 232 lb 4.8 oz (105.4 kg)    Physical Exam Vitals reviewed.  Constitutional:      Appearance: Normal appearance. She is obese.  HENT:     Head: Normocephalic and atraumatic.     Nose: Nose normal.     Mouth/Throat:     Mouth: Mucous membranes are moist.     Pharynx: Oropharynx is clear.  Eyes:     Extraocular Movements: Extraocular movements intact.     Conjunctiva/sclera: Conjunctivae normal.     Pupils: Pupils are equal, round, and reactive to light.  Cardiovascular:     Rate and Rhythm: Normal rate and regular rhythm.     Pulses: Normal pulses.     Heart sounds: Normal heart sounds.  Pulmonary:     Effort: Pulmonary effort is normal.     Breath sounds: Normal breath sounds.  Abdominal:      General: Abdomen is flat. There is no distension.     Palpations: Abdomen is soft. There is no mass.     Tenderness: There is no abdominal tenderness. There is no right CVA tenderness, left CVA tenderness, guarding  or rebound.     Hernia: No hernia is present.  Musculoskeletal:        General: Normal range of motion.     Cervical back: Normal range of motion and neck supple.  Skin:    General: Skin is warm and dry.  Neurological:     General: No focal deficit present.     Mental Status: She is alert and oriented to person, place, and time. Mental status is at baseline.  Psychiatric:        Mood and Affect: Mood normal.        Behavior: Behavior normal.        Thought Content: Thought content normal.        Judgment: Judgment normal.     Results for orders placed or performed in visit on 08/16/21  Urine Culture   Specimen: Urine  Result Value Ref Range   MICRO NUMBER: [id number]    SPECIMEN QUALITY: Adequate    Sample Source URINE    STATUS: FINAL    ISOLATE 1: Streptococcus agalactiae (A)   CBC with Differential/Platelet  Result Value Ref Range   WBC 10.3 3.8 - 10.8 Thousand/uL   RBC 4.81 3.80 - 5.10 Million/uL   Hemoglobin 11.8 11.7 - 15.5 g/dL   HCT 37.7 35.0 - 45.0 %   MCV 78.4 (L) 80.0 - 100.0 fL   MCH 24.5 (L) 27.0 - 33.0 pg   MCHC 31.3 (L) 32.0 - 36.0 g/dL   RDW 15.6 (H) 11.0 - 15.0 %   Platelets 406 (H) 140 - 400 Thousand/uL   MPV 10.4 7.5 - 12.5 fL   Neutro Abs 7,437 1,500 - 7,800 cells/uL   Lymphs Abs 2,081 850 - 3,900 cells/uL   Absolute Monocytes 402 200 - 950 cells/uL   Eosinophils Absolute 319 15 - 500 cells/uL   Basophils Absolute 62 0 - 200 cells/uL   Neutrophils Relative % 72.2 %   Total Lymphocyte 20.2 %   Monocytes Relative 3.9 %   Eosinophils Relative 3.1 %   Basophils Relative 0.6 %  COMPLETE METABOLIC PANEL WITH GFR  Result Value Ref Range   Glucose, Bld 211 (H) 65 - 99 mg/dL   BUN 11 7 - 25 mg/dL   Creat 0.57 0.50 - 0.99 mg/dL   eGFR  116 > OR = 60 mL/min/1.89m2   BUN/Creatinine Ratio NOT APPLICABLE 6 - 22 (calc)   Sodium 134 (L) 135 - 146 mmol/L   Potassium 4.5 3.5 - 5.3 mmol/L   Chloride 96 (L) 98 - 110 mmol/L   CO2 26 20 - 32 mmol/L   Calcium 9.3 8.6 - 10.2 mg/dL   Total Protein 7.7 6.1 - 8.1 g/dL   Albumin 4.2 3.6 - 5.1 g/dL   Globulin 3.5 1.9 - 3.7 g/dL (calc)   AG Ratio 1.2 1.0 - 2.5 (calc)   Total Bilirubin 0.3 0.2 - 1.2 mg/dL   Alkaline phosphatase (APISO) 132 (H) 31 - 125 U/L   AST 63 (H) 10 - 30 U/L   ALT 33 (H) 6 - 29 U/L  Lipid panel  Result Value Ref Range   Cholesterol 274 (H) <200 mg/dL   HDL 31 (L) > OR = 50 mg/dL   Triglycerides 301 (H) <150 mg/dL   LDL Cholesterol (Calc) 192 (H) mg/dL (calc)   Total CHOL/HDL Ratio 8.8 (H) <5.0 (calc)   Non-HDL Cholesterol (Calc) 243 (H) <130 mg/dL (calc)  TSH  Result Value Ref Range   TSH 3.92 mIU/L  Pathologist  smear review  Result Value Ref Range   Path Review    Vitamin B12  Result Value Ref Range   Vitamin B-12 1,154 (H) 200 - 1,100 pg/mL  Vitamin D 1,25 dihydroxy  Result Value Ref Range   Vitamin D 1, 25 (OH)2 Total 36 18 - 72 pg/mL   Vitamin D3 1, 25 (OH)2 36 pg/mL   Vitamin D2 1, 25 (OH)2 <8 pg/mL  Fe+TIBC+Fer  Result Value Ref Range   Iron 36 (L) 40 - 190 mcg/dL   TIBC 525 (H) 250 - 450 mcg/dL (calc)   %SAT 7 (L) 16 - 45 % (calc)   Ferritin 27 16 - 232 ng/mL  HIV Antibody (routine testing w rflx)  Result Value Ref Range   HIV 1&2 Ab, 4th Generation NON-REACTIVE NON-REACTIVE  Hepatitis C antibody  Result Value Ref Range   Hepatitis C Ab NON-REACTIVE NON-REACTIVE   SIGNAL TO CUT-OFF 0.06 <1.00  POCT HgB A1C  Result Value Ref Range   Hemoglobin A1C 11.0 (A) 4.0 - 5.6 %   HbA1c POC (<> result, manual entry)     HbA1c, POC (prediabetic range)     HbA1c, POC (controlled diabetic range)    POCT URINALYSIS DIP (CLINITEK)  Result Value Ref Range   Color, UA yellow yellow   Clarity, UA clear clear   Glucose, UA =250 (A) negative mg/dL    Bilirubin, UA negative negative   Ketones, POC UA trace (5) (A) negative mg/dL   Spec Grav, UA 1.025 1.010 - 1.025   Blood, UA negative negative   pH, UA 5.5 5.0 - 8.0   POC PROTEIN,UA trace negative, trace   Urobilinogen, UA 0.2 0.2 or 1.0 E.U./dL   Nitrite, UA Negative Negative   Leukocytes, UA Small (1+) (A) Negative      Assessment & Plan:   Problem List Items Addressed This Visit       Cardiovascular and Mediastinum   Essential hypertension   Other Visit Diagnoses     Annual physical exam    -  Primary   Relevant Orders   CBC with Differential   COMPLETE METABOLIC PANEL WITH GFR       BP is elevated today. States it is usually high at appointments but better at home. Given significant elevation x2 checks, would like her to monitor at home and bring in home cuff in 2 weeks for BP check at nurse visit.    LABORATORY TESTING:  - Health maintenance labs ordered today as discussed above.    - labs done last month, rechecking CBC, CMP today  - STI testing: deferred - Pap smear: done elsewhere, this year   IMMUNIZATIONS:   - Tdap: Tetanus vaccination status reviewed: last tetanus booster within 10 years. - Influenza: Refused - Pneumovax: Not applicable - Prevnar: Refused - HPV: Not applicable - Shingrix vaccine: Not applicable - [id number]: Refused  SCREENING: - Mammogram: Not applicable - Bone Density: Not applicable - Colonoscopy: Not applicable  - AAA Screening: Not applicable  -Hearing Test: Not applicable  -Spirometry: Not applicable  - Lung Cancer Screening: Not applicable    PATIENT COUNSELING:   Advised to take 1 mg of folate supplement per day if capable of pregnancy.   Sexuality: Discussed sexually transmitted diseases, partner selection, use of condoms, avoidance of unintended pregnancy, and contraceptive alternatives.    I discussed with the patient that most people either abstain from alcohol or drink within safe limits (<=14/week and <=4  drinks/occasion for males, <=7/weeks and <=  3 drinks/occasion for females) and that the risk for alcohol disorders and other health effects rises proportionally with the number of drinks per week and how often a drinker exceeds daily limits.  Discussed cessation/primary prevention of drug use and availability of treatment for abuse.   Diet: Encouraged to adjust caloric intake to maintain or achieve ideal body weight, to reduce intake of dietary saturated fat and total fat, to limit sodium intake by avoiding high sodium foods and not adding table salt, and to maintain adequate dietary potassium and calcium preferably from fresh fruits, vegetables, and low-fat dairy products. Encouraged vitamin D 1000 units and Calcium $RemoveBef'1300mg'fnUjdDsyrx$  or 4 servings of dairy a day.  Emphasized the importance of regular exercise.  Injury prevention: Discussed safety belts, safety helmets, smoke detector, smoking near bedding or upholstery.   Dental health: Discussed importance of regular tooth brushing, flossing, and dental visits.  Follow up plan:  Return in about 2 weeks (around 10/07/2021) for nurse visit BP check .  Purcell Nails Olevia Bowens, DNP, FNP-C

## 2021-09-24 LAB — COMPLETE METABOLIC PANEL WITH GFR
AG Ratio: 1.2 (calc) (ref 1.0–2.5)
ALT: 11 U/L (ref 6–29)
AST: 23 U/L (ref 10–30)
Albumin: 4 g/dL (ref 3.6–5.1)
Alkaline phosphatase (APISO): 128 U/L — ABNORMAL HIGH (ref 31–125)
BUN: 12 mg/dL (ref 7–25)
CO2: 26 mmol/L (ref 20–32)
Calcium: 9.3 mg/dL (ref 8.6–10.2)
Chloride: 95 mmol/L — ABNORMAL LOW (ref 98–110)
Creat: 0.82 mg/dL (ref 0.50–0.99)
Globulin: 3.4 g/dL (calc) (ref 1.9–3.7)
Glucose, Bld: 374 mg/dL — ABNORMAL HIGH (ref 65–99)
Potassium: 4.6 mmol/L (ref 3.5–5.3)
Sodium: 134 mmol/L — ABNORMAL LOW (ref 135–146)
Total Bilirubin: 0.3 mg/dL (ref 0.2–1.2)
Total Protein: 7.4 g/dL (ref 6.1–8.1)
eGFR: 91 mL/min/{1.73_m2} (ref >=60)

## 2021-09-24 LAB — CBC WITH DIFFERENTIAL/PLATELET
Absolute Monocytes: 531 cells/uL (ref 200–950)
Basophils Absolute: 68 cells/uL (ref 0–200)
Basophils Relative: 0.6 %
Eosinophils Absolute: 260 cells/uL (ref 15–500)
Eosinophils Relative: 2.3 %
HCT: 34.5 % — ABNORMAL LOW (ref 35.0–45.0)
Hemoglobin: 10.8 g/dL — ABNORMAL LOW (ref 11.7–15.5)
Lymphs Abs: 2317 cells/uL (ref 850–3900)
MCH: 25.1 pg — ABNORMAL LOW (ref 27.0–33.0)
MCHC: 31.3 g/dL — ABNORMAL LOW (ref 32.0–36.0)
MCV: 80.2 fL (ref 80.0–100.0)
MPV: 11.3 fL (ref 7.5–12.5)
Monocytes Relative: 4.7 %
Neutro Abs: 8125 cells/uL — ABNORMAL HIGH (ref 1500–7800)
Neutrophils Relative %: 71.9 %
Platelets: 436 10*3/uL — ABNORMAL HIGH (ref 140–400)
RBC: 4.3 10*6/uL (ref 3.80–5.10)
RDW: 16.1 % — ABNORMAL HIGH (ref 11.0–15.0)
Total Lymphocyte: 20.5 %
WBC: 11.3 10*3/uL — ABNORMAL HIGH (ref 3.8–10.8)

## 2021-10-08 ENCOUNTER — Ambulatory Visit: Payer: BC Managed Care – PPO

## 2021-10-15 ENCOUNTER — Other Ambulatory Visit: Payer: Self-pay

## 2021-10-15 ENCOUNTER — Ambulatory Visit (INDEPENDENT_AMBULATORY_CARE_PROVIDER_SITE_OTHER): Payer: BC Managed Care – PPO | Admitting: Medical-Surgical

## 2021-10-15 VITALS — BP 142/75 | HR 91

## 2021-10-15 DIAGNOSIS — I10 Essential (primary) hypertension: Secondary | ICD-10-CM

## 2021-10-15 NOTE — Progress Notes (Signed)
Patient presents today as a nurse visit for a blood pressure check.  Patient states she is taking her medication as prescribed without any side effects/adverse effects. Medication and allergy list reviewed with patient and the pharmacy has been verified.   She has been checking her BP at home with her lowest reading being 122/72 and her highest reading being 170/79.   HA: No Dizziness/lightheadedness: No Fever: No BA: No Weakness/Fatigue: No  Sinus pain/pressure: No  Runny nose: No  ST: No  ShOB: No  CP: No  Palps: No Abd pain: No Dysuria: No  N/V/C/D: No    Vital Signs at 3:14 PM with her monitor Blood Pressure: 192/108 Pulse: 104  Vital Signs at 3:17 PM Blood Pressure: 167/77 Pulse: 96 SpO2: 99%  Vital Signs at 3:29 PM Blood Pressure: 142/75 Pulse: 91 SpO2: 100%   Information shared with [name], FNP who instructed me to tell pt to increase the hctz part of the losartan-hctz 100 mg-25 mg, continue to monitor BP at home and keep a log, and to schedule a f/u NV in 2 weeks. Pt states she does not want to increase the hctz because she is already getting up to urinate 5-6 times at night. I spoke with Caryl Asp again who said to make sure that the pt is taking her medicine in the morning, and if not, she needs to. If she is already taking it in the morning then she can continue to take what she is currently on and make lifestyle changes, or she can be changed to a totally different medication that we do not know if she will be able to tolerate well. Pt informed and she said that she takes the med at night but will start taking it in the morning and implement lifestyle changes. Pt also instructed to purchase a new BP monitor because there was such a large difference in the blood pressure reading comparison.

## 2021-10-29 ENCOUNTER — Ambulatory Visit: Payer: BC Managed Care – PPO

## 2021-11-09 ENCOUNTER — Other Ambulatory Visit: Payer: Self-pay | Admitting: Medical-Surgical

## 2021-12-01 ENCOUNTER — Encounter: Payer: Self-pay | Admitting: Medical-Surgical

## 2021-12-01 ENCOUNTER — Other Ambulatory Visit: Payer: Self-pay

## 2021-12-01 ENCOUNTER — Ambulatory Visit (INDEPENDENT_AMBULATORY_CARE_PROVIDER_SITE_OTHER): Payer: BC Managed Care – PPO | Admitting: Medical-Surgical

## 2021-12-01 VITALS — BP 143/82 | HR 91 | Resp 20 | Ht 65.0 in | Wt 230.6 lb

## 2021-12-01 DIAGNOSIS — E1169 Type 2 diabetes mellitus with other specified complication: Secondary | ICD-10-CM | POA: Diagnosis not present

## 2021-12-01 DIAGNOSIS — K219 Gastro-esophageal reflux disease without esophagitis: Secondary | ICD-10-CM | POA: Diagnosis not present

## 2021-12-01 DIAGNOSIS — E1165 Type 2 diabetes mellitus with hyperglycemia: Secondary | ICD-10-CM | POA: Diagnosis not present

## 2021-12-01 DIAGNOSIS — I10 Essential (primary) hypertension: Secondary | ICD-10-CM | POA: Diagnosis not present

## 2021-12-01 DIAGNOSIS — J0100 Acute maxillary sinusitis, unspecified: Secondary | ICD-10-CM

## 2021-12-01 DIAGNOSIS — F411 Generalized anxiety disorder: Secondary | ICD-10-CM

## 2021-12-01 DIAGNOSIS — E785 Hyperlipidemia, unspecified: Secondary | ICD-10-CM

## 2021-12-01 LAB — POCT GLYCOSYLATED HEMOGLOBIN (HGB A1C): HbA1c, POC (controlled diabetic range): 12.1 % — AB (ref 0.0–7.0)

## 2021-12-01 MED ORDER — RYBELSUS 3 MG PO TABS: 3.0000 mg | ORAL_TABLET | Freq: Every day | ORAL | 0 refills | Status: DC

## 2021-12-01 MED ORDER — LOSARTAN POTASSIUM-HCTZ 100-25 MG PO TABS: 1.0000 | ORAL_TABLET | Freq: Every day | ORAL | 1 refills | Status: DC

## 2021-12-01 MED ORDER — AZITHROMYCIN 250 MG PO TABS: ORAL_TABLET | ORAL | 0 refills | Status: AC

## 2021-12-01 NOTE — Progress Notes (Signed)
HPI with pertinent ROS:   CC: 74-month follow-up  HPI: Pleasant 46 year old female presenting today for the following:  Diabetes-not regularly checking sugars at home due to fear of needles.  Taking metformin 500 mg twice daily although she is having some GI issues.  Her last A1c was 11%.  Hypertension-not checking blood pressure regularly at home.  She does have periods during the day where she feels like her blood pressure may be up because her cheeks get red but this is transient.  Taking losartan-HCTZ 100-12.5 mg daily, tolerating well without side effects. .nohtn  Hyperlipidemia-taking rosuvastatin 10 mg daily, tolerating well without side effects.  GERD-continues to have some intermittent issues however is doing fairly well with esomeprazole 20 mg daily.  Mood-has been struggling quite a bit lately due to multiple health concerns.  Has been on hormonal therapy recently for irregular menses and reports this has been playing havoc on her mood as well as her overall wellbeing.  Is now having hot flashes since she is coming off the hormones.  Also notes that she has a lot going on in her life and has had multiple changes which are negatively affecting her.  Denies SI/HI.  I reviewed the past medical history, family history, social history, surgical history, and allergies today and no changes were needed.  Please see the problem list section below in epic for further details.   Physical exam:   General: Well Developed, well nourished, and in no acute distress.  Neuro: Alert and oriented x3.  HEENT: Normocephalic, atraumatic.  Skin: Warm and dry. Cardiac: Regular rate and rhythm, no murmurs rubs or gallops, no lower extremity edema.  Respiratory: Clear to auscultation bilaterally. Not using accessory muscles, speaking in full sentences.  Impression and Recommendations:    1. Essential hypertension Blood pressure not at goal even on recheck.  Switching blood pressure medications to  losartan-HCTZ 100-25 mg daily.  Advised her to keep a log at home of daily blood pressures with a goal of 130/80 or less.  Return in 2 weeks for nurse visit for blood pressure check.  Checking labs today.  2. Hyperlipidemia associated with type 2 diabetes mellitus (HCC) Continue rosuvastatin 10 mg daily.  Checking lipids today.  3. Gastroesophageal reflux disease without esophagitis Continue as omeprazole 20 mg daily.  Avoid food triggers.  4. Anxiety state It does appear that she has a lot going on right now especially in regards to the hormonal therapy and aftereffects.  She does have Xanax that was prescribed back in November that she can use very sparingly.  If she would like to start something as a maintenance medication, she is welcome to reach out via MyChart or phone and we can discuss this.  5. Uncontrolled type 2 diabetes mellitus with hyperglycemia, without long-term current use of insulin (HCC) POCT hemoglobin A1c 12.1% today up from 11%.  Unfortunately she has not responded well to the increase in metformin.  It is tolerable so she will continue metformin XR 500 mg twice daily before meals.  We are adding Rybelsus 3 mg daily to see if this will be covered by insurance and affordable rate.  If not, we will need to look into other options.  Advised her to go online or call her insurance company to discuss what is covered for type 2 diabetes that will be affordable for her.  She is welcome to reach out without information and weekend determine the next option for her should Rybelsus felt to be covered.  Referring to optometry for diabetic eye exam today.  Return in about 3 months (around 02/28/2022) for DM/HTN/HLD follow up. ___________________________________________ Clearnce Sorrel, DNP, APRN, FNP-BC [facility] [place]

## 2021-12-02 NOTE — Progress Notes (Signed)
Order number: [id number] labs added through [facility] portal.

## 2021-12-03 ENCOUNTER — Other Ambulatory Visit: Payer: Self-pay | Admitting: Medical-Surgical

## 2021-12-03 LAB — CBC WITH DIFFERENTIAL/PLATELET
Absolute Monocytes: 462 cells/uL (ref 200–950)
Basophils Absolute: 74 cells/uL (ref 0–200)
Basophils Relative: 0.7 %
Eosinophils Absolute: 273 cells/uL (ref 15–500)
Eosinophils Relative: 2.6 %
HCT: 33.2 % — ABNORMAL LOW (ref 35.0–45.0)
Hemoglobin: 10.4 g/dL — ABNORMAL LOW (ref 11.7–15.5)
Lymphs Abs: 2216 cells/uL (ref 850–3900)
MCH: 24.2 pg — ABNORMAL LOW (ref 27.0–33.0)
MCHC: 31.3 g/dL — ABNORMAL LOW (ref 32.0–36.0)
MCV: 77.4 fL — ABNORMAL LOW (ref 80.0–100.0)
MPV: 10.8 fL (ref 7.5–12.5)
Monocytes Relative: 4.4 %
Neutro Abs: 7476 cells/uL (ref 1500–7800)
Neutrophils Relative %: 71.2 %
Platelets: 449 10*3/uL — ABNORMAL HIGH (ref 140–400)
RBC: 4.29 10*6/uL (ref 3.80–5.10)
RDW: 15.1 % — ABNORMAL HIGH (ref 11.0–15.0)
Total Lymphocyte: 21.1 %
WBC: 10.5 10*3/uL (ref 3.8–10.8)

## 2021-12-03 LAB — COMPLETE METABOLIC PANEL WITH GFR
AG Ratio: 1.2 (calc) (ref 1.0–2.5)
ALT: 14 U/L (ref 6–29)
AST: 47 U/L — ABNORMAL HIGH (ref 10–30)
Albumin: 4.1 g/dL (ref 3.6–5.1)
Alkaline phosphatase (APISO): 130 U/L — ABNORMAL HIGH (ref 31–125)
BUN: 12 mg/dL (ref 7–25)
CO2: 26 mmol/L (ref 20–32)
Calcium: 9.7 mg/dL (ref 8.6–10.2)
Chloride: 96 mmol/L — ABNORMAL LOW (ref 98–110)
Creat: 0.61 mg/dL (ref 0.50–0.99)
Globulin: 3.4 g/dL (calc) (ref 1.9–3.7)
Glucose, Bld: 268 mg/dL — ABNORMAL HIGH (ref 65–99)
Potassium: 4.6 mmol/L (ref 3.5–5.3)
Sodium: 134 mmol/L — ABNORMAL LOW (ref 135–146)
Total Bilirubin: 0.2 mg/dL (ref 0.2–1.2)
Total Protein: 7.5 g/dL (ref 6.1–8.1)
eGFR: 114 mL/min/{1.73_m2} (ref >=60)

## 2021-12-03 LAB — TEST AUTHORIZATION

## 2021-12-03 LAB — LIPID PANEL
Cholesterol: 190 mg/dL (ref <200)
HDL: 29 mg/dL — ABNORMAL LOW (ref >=50)
Non-HDL Cholesterol (Calc): 161 mg/dL (calc) — ABNORMAL HIGH (ref <130)
Total CHOL/HDL Ratio: 6.6 (calc) — ABNORMAL HIGH (ref <5.0)
Triglycerides: 441 mg/dL — ABNORMAL HIGH (ref <150)

## 2021-12-03 LAB — LDL CHOLESTEROL, DIRECT: Direct LDL: 100 mg/dL — ABNORMAL HIGH (ref <100)

## 2021-12-03 MED ORDER — ROSUVASTATIN CALCIUM 20 MG PO TABS: ORAL_TABLET | ORAL | 1 refills | Status: DC

## 2021-12-06 ENCOUNTER — Encounter: Payer: Self-pay | Admitting: Medical-Surgical

## 2021-12-15 ENCOUNTER — Other Ambulatory Visit: Payer: Self-pay

## 2021-12-15 ENCOUNTER — Ambulatory Visit (INDEPENDENT_AMBULATORY_CARE_PROVIDER_SITE_OTHER): Payer: BC Managed Care – PPO | Admitting: Medical-Surgical

## 2021-12-15 VITALS — BP 132/70 | HR 81

## 2021-12-15 DIAGNOSIS — I10 Essential (primary) hypertension: Secondary | ICD-10-CM

## 2021-12-15 NOTE — Progress Notes (Signed)
Pt here for nurse BP check.  Pt denies CP, SOB, headaches, dizziness or missed doses of medications.  T. Irys Nigh, CMA ? ?

## 2021-12-15 NOTE — Progress Notes (Signed)
Blood pressure at goal with lisinopril 100 mg along with hydrochlorothiazide 25 mg.  Continue combination pill as prescribed.  Follow-up in 3 months. ? ?___________________________________________ ?Clearnce Sorrel, DNP, APRN, FNP-BC ?Primary Care and Sports Medicine ?[place] ? ?

## 2021-12-20 ENCOUNTER — Other Ambulatory Visit: Payer: Self-pay | Admitting: Medical-Surgical

## 2021-12-20 ENCOUNTER — Encounter: Payer: Self-pay | Admitting: Medical-Surgical

## 2021-12-20 MED ORDER — DOXYCYCLINE HYCLATE 100 MG PO TABS: 100.0000 mg | ORAL_TABLET | Freq: Two times a day (BID) | ORAL | 0 refills | Status: AC

## 2021-12-21 MED ORDER — ALPRAZOLAM 0.5 MG PO TABS
0.5000 mg | ORAL_TABLET | Freq: Every day | ORAL | 0 refills | Status: DC | PRN
  Filled 2021-12-21: qty 15, 15d supply, fill #0

## 2021-12-21 MED ORDER — MELOXICAM 15 MG PO TABS
15.0000 mg | ORAL_TABLET | Freq: Every day | ORAL | 1 refills | Status: DC | PRN
  Filled 2021-12-21: qty 90, 90d supply, fill #0

## 2021-12-22 ENCOUNTER — Other Ambulatory Visit (HOSPITAL_COMMUNITY): Payer: Self-pay

## 2021-12-31 ENCOUNTER — Other Ambulatory Visit: Payer: Self-pay

## 2022-01-12 LAB — HM DIABETES EYE EXAM

## 2022-01-13 ENCOUNTER — Encounter: Payer: Self-pay | Admitting: Medical-Surgical

## 2022-01-13 ENCOUNTER — Ambulatory Visit (INDEPENDENT_AMBULATORY_CARE_PROVIDER_SITE_OTHER): Payer: BC Managed Care – PPO | Admitting: Medical-Surgical

## 2022-01-13 VITALS — BP 146/78 | HR 87 | Ht 65.0 in | Wt 228.0 lb

## 2022-01-13 DIAGNOSIS — L989 Disorder of the skin and subcutaneous tissue, unspecified: Secondary | ICD-10-CM

## 2022-01-13 DIAGNOSIS — E1165 Type 2 diabetes mellitus with hyperglycemia: Secondary | ICD-10-CM | POA: Diagnosis not present

## 2022-01-13 MED ORDER — RYBELSUS 3 MG PO TABS: 3.0000 mg | ORAL_TABLET | Freq: Every day | ORAL | 2 refills | Status: DC

## 2022-01-13 NOTE — Progress Notes (Signed)
?  HPI with pertinent ROS:  ? ?CC: Lesion removal ? ?HPI: ?Pleasant 46 year old female presenting for lesion removal from the left side of her scalp.  Has had a large skin tag/mole present on her scalp for quite a while and feels that it needs to be removed.  Notes that when she brushes her hair or goes to get her hair done, she has issues with it getting caught by the brush or comb.  No redness, swelling, or drainage to the lesion.  ? ?I reviewed the past medical history, family history, social history, surgical history, and allergies today and no changes were needed.  Please see the problem list section below in epic for further details. ? ? ?Physical exam:  ? ?General: Well Developed, well nourished, and in no acute distress.  ?Neuro: Alert and oriented x3.  ?HEENT: Normocephalic, atraumatic.  ?Skin: Warm and dry. <1cm round flesh toned nevus to the left posterior scalp, pedunculated with large base. No erythema, edema, drainage, or fluctuance noted.     ?Cardiac: Regular rate and rhythm.  ?Respiratory: Not using accessory muscles, speaking in full sentences. ? ?Impression and Recommendations:   ? ?1. Uncontrolled type 2 diabetes mellitus with hyperglycemia, without long-term current use of insulin ([place]) ?Refilling Semaglutide per patient request.  ?- Semaglutide (RYBELSUS) 3 MG TABS; Take 3 mg by mouth daily.  Dispense: 30 tablet; Refill: 2 ? ?2. Lesion of skin of scalp ?After informed consent was obtained, using chlorhexidine for cleansing and 1% Lidocaine without epinephrine for anesthetic, with sterile technique, shave excision was performed.  Hemostasis achieved with manual pressure and silver nitrate.  Antibiotic dressing is applied, and wound care instructions provided.  Be alert for any signs of cutaneous infection. The procedure was well tolerated without complications. Follow up: the patient may return prn. ? ?Return if symptoms worsen or fail to improve. ?___________________________________________ ?Clearnce Sorrel, DNP, APRN, FNP-BC ?Primary Care and Sports Medicine ?[place] ?

## 2022-01-24 ENCOUNTER — Other Ambulatory Visit: Payer: Self-pay | Admitting: Medical-Surgical

## 2022-01-24 DIAGNOSIS — E1165 Type 2 diabetes mellitus with hyperglycemia: Secondary | ICD-10-CM

## 2022-01-31 ENCOUNTER — Other Ambulatory Visit: Payer: Self-pay | Admitting: Medical-Surgical

## 2022-01-31 DIAGNOSIS — E1165 Type 2 diabetes mellitus with hyperglycemia: Secondary | ICD-10-CM

## 2022-02-02 ENCOUNTER — Encounter: Payer: Self-pay | Admitting: Medical-Surgical

## 2022-02-02 DIAGNOSIS — E1165 Type 2 diabetes mellitus with hyperglycemia: Secondary | ICD-10-CM

## 2022-02-02 MED ORDER — ROSUVASTATIN CALCIUM 20 MG PO TABS: ORAL_TABLET | ORAL | 1 refills | Status: DC

## 2022-02-02 MED ORDER — RYBELSUS 3 MG PO TABS: 3.0000 mg | ORAL_TABLET | Freq: Every day | ORAL | 2 refills | Status: DC

## 2022-02-02 MED ORDER — LOSARTAN POTASSIUM-HCTZ 100-25 MG PO TABS: 1.0000 | ORAL_TABLET | Freq: Every day | ORAL | 1 refills | Status: DC

## 2022-02-02 MED ORDER — ESOMEPRAZOLE MAGNESIUM 20 MG PO CPDR: 20.0000 mg | DELAYED_RELEASE_CAPSULE | Freq: Every day | ORAL | 1 refills | Status: DC

## 2022-02-02 MED ORDER — METFORMIN HCL ER 500 MG PO TB24: 500.0000 mg | ORAL_TABLET | Freq: Two times a day (BID) | ORAL | 0 refills | Status: DC

## 2022-02-09 ENCOUNTER — Telehealth: Payer: Self-pay

## 2022-02-09 NOTE — Telephone Encounter (Addendum)
Initiated Prior authorization FEO:[id number] Magnesium '20MG'$  dr capsules ?Via: Covermymeds ?Case/Key:[id number] ?Status: approved as of 02/08/22 ?Reason: Coverage Starts on: 02/08/2022 12:00:00 AM, Coverage Ends on: 02/08/2023 ?Notified Pt via: Mychart ? ? ?Initiated Prior authorization QDI:YMEBRAXE '3MG'$  tablets ?Via: Covermymeds ?Case/Key:[id number] ?Status:Denied  as of 02/08/22 ?Reason:Quant limit 90 is not covered, 30/30 is covered  ?Notified Pt via: Mychart ? ?

## 2022-02-14 ENCOUNTER — Encounter: Payer: Self-pay | Admitting: Medical-Surgical

## 2022-02-25 ENCOUNTER — Encounter: Payer: Self-pay | Admitting: Medical-Surgical

## 2022-03-02 ENCOUNTER — Ambulatory Visit (INDEPENDENT_AMBULATORY_CARE_PROVIDER_SITE_OTHER): Payer: BC Managed Care – PPO | Admitting: Medical-Surgical

## 2022-03-02 ENCOUNTER — Telehealth: Payer: Self-pay | Admitting: *Deleted

## 2022-03-02 ENCOUNTER — Encounter: Payer: Self-pay | Admitting: Medical-Surgical

## 2022-03-02 VITALS — BP 144/84 | HR 87 | Resp 20 | Ht 65.0 in | Wt 227.6 lb

## 2022-03-02 DIAGNOSIS — E1165 Type 2 diabetes mellitus with hyperglycemia: Secondary | ICD-10-CM | POA: Diagnosis not present

## 2022-03-02 DIAGNOSIS — I10 Essential (primary) hypertension: Secondary | ICD-10-CM | POA: Diagnosis not present

## 2022-03-02 DIAGNOSIS — R300 Dysuria: Secondary | ICD-10-CM | POA: Insufficient documentation

## 2022-03-02 DIAGNOSIS — E1169 Type 2 diabetes mellitus with other specified complication: Secondary | ICD-10-CM | POA: Diagnosis not present

## 2022-03-02 DIAGNOSIS — K3184 Gastroparesis: Secondary | ICD-10-CM | POA: Diagnosis not present

## 2022-03-02 DIAGNOSIS — E785 Hyperlipidemia, unspecified: Secondary | ICD-10-CM

## 2022-03-02 LAB — POCT URINALYSIS DIP (CLINITEK)
Bilirubin, UA: NEGATIVE
Blood, UA: NEGATIVE
Glucose, UA: 500 mg/dL — AB
Nitrite, UA: NEGATIVE
POC PROTEIN,UA: 30 — AB
Spec Grav, UA: 1.025 (ref 1.010–1.025)
Urobilinogen, UA: 0.2 E.U./dL
pH, UA: 5.5 (ref 5.0–8.0)

## 2022-03-02 LAB — POCT UA - MICROALBUMIN
Creatinine, POC: 200 mg/dL
Microalbumin Ur, POC: 80 mg/L

## 2022-03-02 LAB — WET PREP FOR TRICH, YEAST, CLUE
MICRO NUMBER:: 13439380
Specimen Quality: ADEQUATE

## 2022-03-02 LAB — POCT GLYCOSYLATED HEMOGLOBIN (HGB A1C): HbA1c, POC (controlled diabetic range): 12.8 % — AB (ref 0.0–7.0)

## 2022-03-02 MED ORDER — VALSARTAN-HYDROCHLOROTHIAZIDE 160-25 MG PO TABS: 1.0000 | ORAL_TABLET | Freq: Every day | ORAL | 3 refills | Status: DC

## 2022-03-02 MED ORDER — CLOTRIMAZOLE-BETAMETHASONE 1-0.05 % EX CREA: 1.0000 "application " | TOPICAL_CREAM | Freq: Every day | CUTANEOUS | 0 refills | Status: AC

## 2022-03-02 MED ORDER — FLUCONAZOLE 150 MG PO TABS: 150.0000 mg | ORAL_TABLET | Freq: Once | ORAL | 0 refills | Status: AC

## 2022-03-02 MED ORDER — GLIPIZIDE 5 MG PO TABS: 5.0000 mg | ORAL_TABLET | Freq: Two times a day (BID) | ORAL | 3 refills | Status: DC

## 2022-03-02 NOTE — Assessment & Plan Note (Signed)
Referring to GI for further recommendations.

## 2022-03-02 NOTE — Assessment & Plan Note (Signed)
POCT A1c 12.8%, last one 12.1%.  POCT microalbumin abnormal.  Continue metformin as prescribed.  Adding glipizide 5 mg twice daily with meals.  Candid discussion regarding treatment of uncontrolled diabetes and the effects that elevated sugars can have on the body chronically.  Ultimately, I feel that she is going to need to be on an injectable drug however she is not at the point that she is ready to consent to this.  Referral entered for clinical pharmacy evaluation and recommendations.

## 2022-03-02 NOTE — Assessment & Plan Note (Signed)
Blood pressure elevated at home as well as in office.  Switching to valsartan-HCTZ 160-25 mg daily.  Continue monitoring blood pressure with a goal of 130/80 or less.  Low-sodium diet and regular intentional exercise for weight loss is recommended.

## 2022-03-02 NOTE — Assessment & Plan Note (Signed)
Continue Crestor 20 mg daily. 

## 2022-03-02 NOTE — Assessment & Plan Note (Signed)
POCT urinalysis positive for small amount of protein and ketones, glucose, and leukocytes, negative nitrites and blood.  Sending for culture.  Stat wet prep today. Suspect yeast is the culprit with her uncontrolled sugars.

## 2022-03-02 NOTE — Chronic Care Management (AMB) (Signed)
  Care Management   Outreach Note  03/02/2022 Name: Lindsey Maxwell MRN: [id number] DOB: 06/22/1976  Referred by: [name], NP Reason for referral : Care Coordination (Initial outreach to schedule referral with Pharm D)   An unsuccessful telephone outreach was attempted today. The patient was referred to the case management team for assistance with care management and care coordination.   Follow Up Plan:  A HIPAA compliant phone message was left for the patient providing contact information and requesting a return call.    Julian Hy, [facility]  Direct Dial: [phone]   [email or web address]

## 2022-03-02 NOTE — Addendum Note (Signed)
Addended bySamuel Bouche on: 03/02/2022 01:06 PM   Modules accepted: Orders

## 2022-03-02 NOTE — Progress Notes (Signed)
Established Patient Office Visit  Subjective   Patient ID: Lindsey Maxwell, female    DOB: [birth year]  Age: 46 y.o. MRN: [id number]  Chief Complaint  Patient presents with   Follow-up   Hypertension   Diabetes    HPI Pleasant 46 year old female presenting today for follow up on:  DM-tried to get Rybelsus covered but unfortunately her insurance company is flat out denying it.  They want her to use an injectable instead. She is terrified of needles and very resistant to the idea of injections.  She is taking metformin as prescribed, tolerating well overall without side effects.  She does have some GI issues as noted below but these are not likely related to the medication.  She is not regularly checking her sugars and is unable to provide a range of her previous readings.  Notes that her biggest problem is sodas and sweet tea.  She has been trying to do half-and-half at times but some days are better than others.  HTN-taking losartan-HCTZ 100-25 mg daily as prescribed, tolerating well without side effects.  Has been checking blood pressures at home with results higher than goal on average.  She has had some that look good but most are above 130/80.    HLD-taking Crestor 20 mg daily as prescribed, tolerating well without side effects.  GI concerns: Has been having intermittent issues with nausea, vomiting, early satiety, and dysphagia.  Notes that she was diagnosed with gastroparesis in the past and is not currently taking any medications to manage this.  She does take Nexium 20 mg daily which is somewhat helpful.  Has not been evaluated or managed by GI in several years.  Having issues with dysuria, burning with urination, and vaginal itching.  This has been going on for several days.  She has not tried to treat this with any over-the-counter options.  Review of Systems  Constitutional:  Negative for chills, fever and malaise/fatigue.  Respiratory:  Negative for cough, shortness of breath and  wheezing.   Cardiovascular:  Negative for chest pain, palpitations and leg swelling.  Gastrointestinal:  Positive for constipation, diarrhea, heartburn, nausea and vomiting.  Genitourinary:  Positive for dysuria.       Vaginal itching  Neurological:  Negative for dizziness and headaches.  Psychiatric/Behavioral:  Negative for depression and suicidal ideas. The patient is not nervous/anxious and does not have insomnia.      Objective:     BP (!) 144/84 (BP Location: Right Arm, Cuff Size: Large)   Pulse 87   Resp 20   Ht '5\' 5"'$  (1.651 m)   Wt 227 lb 9.6 oz (103.2 kg)   SpO2 99%   BMI 37.87 kg/m    Physical Exam Vitals reviewed.  Constitutional:      General: She is not in acute distress.    Appearance: Normal appearance. She is obese. She is not ill-appearing.  HENT:     Head: Normocephalic and atraumatic.  Cardiovascular:     Rate and Rhythm: Normal rate and regular rhythm.     Pulses: Normal pulses.     Heart sounds: Normal heart sounds. No murmur heard.   No friction rub. No gallop.  Pulmonary:     Effort: Pulmonary effort is normal. No respiratory distress.     Breath sounds: Normal breath sounds. No wheezing.  Abdominal:     General: There is no distension.     Palpations: Abdomen is soft.  Skin:    General: Skin is warm and dry.  Neurological:     Mental Status: She is alert and oriented to person, place, and time.  Psychiatric:        Mood and Affect: Mood normal.        Behavior: Behavior normal.        Thought Content: Thought content normal.        Judgment: Judgment normal.   Results for orders placed or performed in visit on 03/02/22  POCT HgB A1C  Result Value Ref Range   Hemoglobin A1C     HbA1c POC (<> result, manual entry)     HbA1c, POC (prediabetic range)     HbA1c, POC (controlled diabetic range) 12.8 (A) 0.0 - 7.0 %  POCT URINALYSIS DIP (CLINITEK)  Result Value Ref Range   Color, UA yellow yellow   Clarity, UA clear clear   Glucose, UA  =500 (A) negative mg/dL   Bilirubin, UA negative negative   Ketones, POC UA small (15) (A) negative mg/dL   Spec Grav, UA 1.025 1.010 - 1.025   Blood, UA negative negative   pH, UA 5.5 5.0 - 8.0   POC PROTEIN,UA =30 (A) negative, trace   Urobilinogen, UA 0.2 0.2 or 1.0 E.U./dL   Nitrite, UA Negative Negative   Leukocytes, UA Trace (A) Negative  POCT UA - Microalbumin  Result Value Ref Range   Microalbumin Ur, POC 80 mg/L   Creatinine, POC 200 mg/dL   Albumin/Creatinine Ratio, Urine, POC 30-300       The 10-year ASCVD risk score (Arnett DK, et al., 2019) is: 6.4%    Assessment & Plan:   Problem List Items Addressed This Visit       Cardiovascular and Mediastinum   Essential hypertension - Primary    Blood pressure elevated at home as well as in office.  Switching to valsartan-HCTZ 160-25 mg daily.  Continue monitoring blood pressure with a goal of 130/80 or less.  Low-sodium diet and regular intentional exercise for weight loss is recommended.       Relevant Medications   valsartan-hydrochlorothiazide (DIOVAN-HCT) 160-25 MG tablet   Other Relevant Orders   AMB Referral to [facility]     Digestive   Gastroparesis    Referring to GI for further recommendations.       Relevant Orders   Ambulatory referral to Gastroenterology     Endocrine   Hyperlipidemia associated with type 2 diabetes mellitus (HCC)    Continue Crestor 20 mg daily.       Relevant Medications   valsartan-hydrochlorothiazide (DIOVAN-HCT) 160-25 MG tablet   glipiZIDE (GLUCOTROL) 5 MG tablet   Other Relevant Orders   AMB Referral to [facility]   Uncontrolled type 2 diabetes mellitus with hyperglycemia, without long-term current use of insulin (HCC)    POCT A1c 12.8%, last one 12.1%.  POCT microalbumin abnormal.  Continue metformin as prescribed.  Adding glipizide 5 mg twice daily with meals.  Candid discussion regarding treatment of uncontrolled diabetes and the  effects that elevated sugars can have on the body chronically.  Ultimately, I feel that she is going to need to be on an injectable drug however she is not at the point that she is ready to consent to this.  Referral entered for clinical pharmacy evaluation and recommendations.       Relevant Medications   valsartan-hydrochlorothiazide (DIOVAN-HCT) 160-25 MG tablet   glipiZIDE (GLUCOTROL) 5 MG tablet   Other Relevant Orders   POCT HgB A1C (Completed)   AMB Referral  to [facility]     Other   Dysuria    POCT urinalysis positive for small amount of protein and ketones, glucose, and leukocytes, negative nitrites and blood.  Sending for culture.  Stat wet prep today. Suspect yeast is the culprit with her uncontrolled sugars.       Relevant Orders   POCT URINALYSIS DIP (CLINITEK) (Completed)   POCT UA - Microalbumin (Completed)   Urine Culture   WET PREP FOR [place], YEAST, CLUE    Return in about 3 months (around 06/02/2022) for DM follow up.   [name], NP

## 2022-03-04 LAB — URINE CULTURE
MICRO NUMBER:: 13444100
SPECIMEN QUALITY:: ADEQUATE

## 2022-03-08 NOTE — Chronic Care Management (AMB) (Unsigned)
  Care Management   Outreach Note  03/08/2022 Name: Lindsey Maxwell MRN: [id number] DOB: 1976-04-15  Referred by: [name], NP Reason for referral : Care Coordination (Initial outreach to schedule referral with Pharm D)   A second unsuccessful telephone outreach was attempted today. The patient was referred to the case management team for assistance with care management and care coordination.   Follow Up Plan:  A HIPAA compliant phone message was left for the patient providing contact information and requesting a return call.   Julian Hy, [facility]  Direct Dial: [phone]

## 2022-03-09 NOTE — Chronic Care Management (AMB) (Signed)
  Care Management   Note  03/09/2022 Name: Ameliarose Shark MRN: [id number] DOB: [birth year]  Alycen Mack is a 46 y.o. year old female who is a primary care patient of [name], NP. I reached out to Camila Li by phone today offer care coordination services.   Ms. Filion was given information about care management services today including:  Care management services include personalized support from designated clinical staff supervised by her physician, including individualized plan of care and coordination with other care providers 24/7 contact phone numbers for assistance for urgent and routine care needs. The patient may stop care management services at any time by phone call to the office staff.  Patient agreed to services and verbal consent obtained.   Follow up plan: Telephone appointment with care management team member scheduled for: 03/11/2022  Julian Hy, [facility]  Direct Dial: [phone]

## 2022-03-10 ENCOUNTER — Telehealth: Payer: Self-pay | Admitting: Pharmacist

## 2022-03-10 NOTE — Chronic Care Management (AMB) (Signed)
    Chronic Care Management Pharmacy Assistant   Name: Lindsey Maxwell  MRN: [id number] DOB: [birth year]  Lindsey Maxwell is an 46 y.o. year old female who presents for his initial CCM visit with the clinical pharmacist.  Recent office visits:  03/02/22-Joy Charna Archer, NP (PCP) Follow visit on diabetes ad hypertension. Start on Valsartan HCTZ 160-25 mg daily. AMB Referral to [facility]. Ambulatory referral to Gastroenterology. Start on Glipizide 5 mg twice daily. Follow up in 3 months. 01/13/22-Joy Jessup, NP (PCP) Seen for lesion removal. Return if symptoms worsen or fail to improve. 12/01/21-Joy Jessup, NP (PCP) 3 month follow up visit. Start on Losartan HCTZ 100-25 mg once daily.Start on Rybelsus 3 mg daily. Follow up in 3 months. 09/23/21-Taylor B. Olevia Bowens, NP. Annual exam visit. Labs ordered. Follow up in 2 weeks.   Recent consult visits:  None noted  Hospital visits:  None in previous 6 months  Medications: Outpatient Encounter Medications as of 03/10/2022  Medication Sig   ALPRAZolam (XANAX) 0.5 MG tablet Take 1 tablet (0.5 mg total) by mouth daily as needed for anxiety. To be used sparingly for severe anxiety no more than 2-3 times weekly.   clotrimazole-betamethasone (LOTRISONE) cream Apply 1 application. topically daily.   esomeprazole (NEXIUM) 20 MG capsule Take 1 capsule (20 mg total) by mouth daily at 12 noon.   glipiZIDE (GLUCOTROL) 5 MG tablet Take 1 tablet (5 mg total) by mouth 2 (two) times daily before a meal.   [name] 1-20 MG-MCG(24) tablet Take 1 tablet by mouth daily.   meloxicam (MOBIC) 15 MG tablet Take 1 tablet (15 mg total) by mouth daily as needed for pain.   metFORMIN (GLUCOPHAGE XR) 500 MG 24 hr tablet Take 1 tablet (500 mg total) by mouth 2 (two) times daily before a meal.   rosuvastatin (CRESTOR) 20 MG tablet TAKE 1 TABLET(20 MG) BY MOUTH DAILY   valsartan-hydrochlorothiazide (DIOVAN-HCT) 160-25 MG tablet Take 1 tablet by mouth daily.   VYFEMLA 0.4-35  MG-MCG tablet Take 1 tablet by mouth daily.   No facility-administered encounter medications on file as of 03/10/2022.   ALPRAZolam (XANAX) 0.5 MG tablet Last filled:12/23/21 15 DS Clotrimazole-betamethasone (LOTRISONE) cream Last filled:03/02/22 15 DS Esomeprazole (NEXIUM) 20 MG capsule Last filled:None noted GlipiZIDE (GLUCOTROL) 5 MG tablet Last filled:03/02/22 3 DS [name] 1-20 MG-MCG(24) tablet Last filled:12/23/21 84 DS Meloxicam (MOBIC) 15 MG tablet Last filled:12/23/21 90 DS MetFORMIN (GLUCOPHAGE XR) 500 MG 24 hr tablet Last filled:02/02/22 90 DS Rosuvastatin (CRESTOR) 20 MG tablet Last filled:12/20/21 90 DS Valsartan-hydrochlorothiazide (DIOVAN-HCT) 160-25 MG tablet Last filled:03/04/22 90 DS VYFEMLA 0.4-35 MG-MCG tablet Last filled:09/27/21 84 DS   Care Gaps: None noted  Star Rating Drugs: GlipiZIDE (GLUCOTROL) 5 MG tablet Last filled:03/02/22 3 DS MetFORMIN (GLUCOPHAGE XR) 500 MG 24 hr tablet Last filled:02/02/22 90 DS Rosuvastatin (CRESTOR) 20 MG tablet Last filled:12/20/21 90 DS Valsartan-hydrochlorothiazide (DIOVAN-HCT) 160-25 MG tablet Last filled:03/04/22 90 DS  Myriam Elta [place], [place]

## 2022-03-10 NOTE — Progress Notes (Signed)
Care Management   Pharmacy Note  03/10/2022 Name: Lindsey Maxwell MRN: [id number] DOB: 1976/06/21  Summary:  Addressed HTN, HLD, and primarily DM. At home, SBP 160-180s, but was recently changed from losartan to valsartan-hctz. She describes SBP 120-140s on new combination.   Her a1c is highest it has ever been for her - she relates this to more sugary drinks and more stress lately. She is hesitant to use any medication with needles.  Will establish this afternoon with a new gastroenterologist for unresolved nausea & vomiting.   Recommendations: Continue current regimen for now, established an office visit with me in 1 week to apply CGM sample sensor, more in-depth review of CGM as well as ideal medications for her glucose control.  Follow Up: f/u with pharmacist in 1 week  Subjective: Lindsey Maxwell is a 46 y.o. year old female who is a primary care patient of [name], NP. The Care Management team was consulted for assistance with care management and care coordination needs.    Engaged with patient by telephone for initial visit in response to provider referral for pharmacy case management and/or care coordination services.   The patient was given information about Care Management services today including:  Care Management services includes personalized support from designated clinical staff supervised by the patient's primary care provider, including individualized plan of care and coordination with other care providers. 24/7 contact phone numbers for assistance for urgent and routine care needs. The patient may stop case management services at any time by phone call to the office staff.  Patient agreed to services and consent obtained.  Assessment:  Review of patient status, including review of consultants reports, laboratory and other test data, was performed as part of comprehensive evaluation and provision of chronic care management services.   SDOH (Social Determinants of Health)  assessments and interventions performed:    Objective:  Lab Results  Component Value Date   CREATININE 0.61 12/01/2021   CREATININE 0.82 09/23/2021   CREATININE 0.57 08/16/2021    Lab Results  Component Value Date   HGBA1C 12.8 (A) 03/02/2022       Component Value Date/Time   CHOL 190 12/01/2021 0000   TRIG 441 (H) 12/01/2021 0000   HDL 29 (L) 12/01/2021 0000   CHOLHDL 6.6 (H) 12/01/2021 0000   LDLCALC  12/01/2021 0000     Comment:     . LDL cholesterol not calculated. Triglyceride levels greater than 400 mg/dL invalidate calculated LDL results. . Reference range: <100 . Desirable range <100 mg/dL for primary prevention;   <70 mg/dL for patients with CHD or diabetic patients  with > or = 2 CHD risk factors. Marland Kitchen LDL-C is now calculated using the Martin-Hopkins  calculation, which is a validated novel method providing  better accuracy than the Friedewald equation in the  estimation of LDL-C.  Cresenciano Genre et al. Annamaria Helling. 6237;628(31): 2061-2068  (http://education.[employer].com/faq/FAQ164)    LDLDIRECT 100 (H) 12/01/2021 0000     BP Readings from Last 3 Encounters:  03/02/22 (!) 144/84  01/13/22 (!) 146/78  12/15/21 132/70    Care Plan  Allergies  Allergen Reactions   Sitagliptin Swelling   Amlodipine Nausea And Vomiting    Headaches    Dapagliflozin    Losartan     Dizziness, burning sensation   Metformin Nausea And Vomiting   Metoprolol Nausea And Vomiting    headache   Venlafaxine     Swets, chills, shaky   Atorvastatin Diarrhea   Clonidine Cough  Dry throat     Medications Reviewed Today     Reviewed by Beverlee Nims, CMA (Certified Medical Assistant) on 03/02/22 at [phone]  Med List Status: <None>   Medication Order Taking? Sig Documenting Provider Last Dose Status Informant  ALPRAZolam (XANAX) 0.5 MG tablet [id number] Yes Take 1 tablet (0.5 mg total) by mouth daily as needed for anxiety. To be used sparingly for severe anxiety no more  than 2-3 times weekly. [name], NP Taking Active   esomeprazole (NEXIUM) 20 MG capsule [id number] Yes Take 1 capsule (20 mg total) by mouth daily at 12 noon. [name], NP Taking Active   glipiZIDE (GLUCOTROL) 5 MG tablet [id number] Yes Take 1 tablet (5 mg total) by mouth 2 (two) times daily before a meal. Jessup, Joy, NP  Active   [name] 1-20 MG-MCG(24) tablet [id number] Yes Take 1 tablet by mouth daily. [provider] Taking Active   meloxicam (MOBIC) 15 MG tablet [id number] Yes Take 1 tablet (15 mg total) by mouth daily as needed for pain. [name], NP Taking Active   metFORMIN (GLUCOPHAGE XR) 500 MG 24 hr tablet [id number] Yes Take 1 tablet (500 mg total) by mouth 2 (two) times daily before a meal. [name], NP Taking Active   rosuvastatin (CRESTOR) 20 MG tablet [id number] Yes TAKE 1 TABLET(20 MG) BY MOUTH DAILY [name], NP Taking Active   valsartan-hydrochlorothiazide (DIOVAN-HCT) 160-25 MG tablet [id number] Yes Take 1 tablet by mouth daily. [name], NP  Active   VYFEMLA 0.4-35 MG-MCG tablet [id number] Yes Take 1 tablet by mouth daily. [provider] Taking Active             Patient Active Problem List   Diagnosis Date Noted   Uncontrolled type 2 diabetes mellitus with hyperglycemia, without long-term current use of insulin ([place]) 03/02/2022   Gastroparesis 03/02/2022   Dysuria 03/02/2022   History of anemia 03/18/2020   Morbid obesity ([place]) 09/14/2018   Vitamin B 12 deficiency 01/22/2018   Vitamin D deficiency 01/22/2018   Tobacco use disorder 12/28/2016   Microalbuminuria 62/70/3500   Neutrophilic leukocytosis 93/81/8299   Thrombocytosis 11/22/2016   Hyperlipidemia associated with type 2 diabetes mellitus ([place]) 03/19/2015   OSA (obstructive sleep apnea) 08/15/2014   Anxiety state 12/30/2013   Essential hypertension 12/30/2013   Gastroesophageal reflux disease without esophagitis 11/19/2013   Hirsutism 11/19/2013   Insomnia 11/19/2013    Polycystic ovarian syndrome 11/19/2013    Conditions to be addressed/monitored: HTN, HLD, and DMII  There are no care plans that you recently modified to display for this patient.   Medication Assistance:   TBD  Follow Up:  Patient agrees to Care Plan and Follow-up.  Plan: Face to Face appointment with care management team member scheduled for: 1 week  Larinda Buttery, PharmD Clinical Pharmacist [facility] [phone]

## 2022-03-11 ENCOUNTER — Encounter: Payer: Self-pay | Admitting: Internal Medicine

## 2022-03-11 ENCOUNTER — Ambulatory Visit (INDEPENDENT_AMBULATORY_CARE_PROVIDER_SITE_OTHER): Payer: BC Managed Care – PPO | Admitting: Internal Medicine

## 2022-03-11 ENCOUNTER — Other Ambulatory Visit (INDEPENDENT_AMBULATORY_CARE_PROVIDER_SITE_OTHER): Payer: BC Managed Care – PPO

## 2022-03-11 ENCOUNTER — Ambulatory Visit: Payer: BC Managed Care – PPO | Admitting: Pharmacist

## 2022-03-11 VITALS — BP 160/100 | HR 96 | Ht 65.0 in | Wt 232.0 lb

## 2022-03-11 DIAGNOSIS — D649 Anemia, unspecified: Secondary | ICD-10-CM

## 2022-03-11 DIAGNOSIS — K921 Melena: Secondary | ICD-10-CM

## 2022-03-11 DIAGNOSIS — R198 Other specified symptoms and signs involving the digestive system and abdomen: Secondary | ICD-10-CM

## 2022-03-11 DIAGNOSIS — K3184 Gastroparesis: Secondary | ICD-10-CM | POA: Diagnosis not present

## 2022-03-11 DIAGNOSIS — I10 Essential (primary) hypertension: Secondary | ICD-10-CM

## 2022-03-11 DIAGNOSIS — R7989 Other specified abnormal findings of blood chemistry: Secondary | ICD-10-CM

## 2022-03-11 DIAGNOSIS — E1169 Type 2 diabetes mellitus with other specified complication: Secondary | ICD-10-CM

## 2022-03-11 DIAGNOSIS — R112 Nausea with vomiting, unspecified: Secondary | ICD-10-CM | POA: Diagnosis not present

## 2022-03-11 DIAGNOSIS — K219 Gastro-esophageal reflux disease without esophagitis: Secondary | ICD-10-CM

## 2022-03-11 DIAGNOSIS — E1165 Type 2 diabetes mellitus with hyperglycemia: Secondary | ICD-10-CM

## 2022-03-11 LAB — CBC WITH DIFFERENTIAL/PLATELET
Basophils Absolute: 0.1 10*3/uL (ref 0.0–0.1)
Basophils Relative: 0.8 % (ref 0.0–3.0)
Eosinophils Absolute: 0.3 10*3/uL (ref 0.0–0.7)
Eosinophils Relative: 2.3 % (ref 0.0–5.0)
HCT: 30.7 % — ABNORMAL LOW (ref 36.0–46.0)
Hemoglobin: 9.6 g/dL — ABNORMAL LOW (ref 12.0–15.0)
Lymphocytes Relative: 23.1 % (ref 12.0–46.0)
Lymphs Abs: 2.7 10*3/uL (ref 0.7–4.0)
MCHC: 31.4 g/dL (ref 30.0–36.0)
MCV: 72.7 fl — ABNORMAL LOW (ref 78.0–100.0)
Monocytes Absolute: 0.6 10*3/uL (ref 0.1–1.0)
Monocytes Relative: 4.9 % (ref 3.0–12.0)
Neutro Abs: 8 10*3/uL — ABNORMAL HIGH (ref 1.4–7.7)
Neutrophils Relative %: 68.9 % (ref 43.0–77.0)
Platelets: 443 10*3/uL — ABNORMAL HIGH (ref 150.0–400.0)
RBC: 4.22 Mil/uL (ref 3.87–5.11)
RDW: 17.7 % — ABNORMAL HIGH (ref 11.5–15.5)
WBC: 11.7 10*3/uL — ABNORMAL HIGH (ref 4.0–10.5)

## 2022-03-11 LAB — LIPASE: Lipase: 43 U/L (ref 11.0–59.0)

## 2022-03-11 MED ORDER — ESOMEPRAZOLE MAGNESIUM 40 MG PO CPDR: 40.0000 mg | DELAYED_RELEASE_CAPSULE | Freq: Every day | ORAL | 2 refills | Status: DC

## 2022-03-11 NOTE — Patient Instructions (Addendum)
If you are age 44 or older, your body mass index should be between 23-30. Your Body mass index is 38.61 kg/m. If this is out of the aforementioned range listed, please consider follow up with your Primary Care Provider.  If you are age 46 or younger, your body mass index should be between 19-25. Your Body mass index is 38.61 kg/m. If this is out of the aformentioned range listed, please consider follow up with your Primary Care Provider.   Your provider has requested that you go to the basement level for lab work before leaving today. Press "B" on the elevator. The lab is located at the first door on the left as you exit the elevator.   You have been scheduled for an endoscopy and colonoscopy. Please follow the written instructions given to you at your visit today. Please pick up your prep supplies at the pharmacy within the next 1-3 days. If you use inhalers (even only as needed), please bring them with you on the day of your procedure.   The [facility] providers would like to encourage you to use [facility] to communicate with providers for non-urgent requests or questions.  Due to long hold times on the telephone, sending your provider a message by [facility] may be a faster and more efficient way to get a response.  Please allow 48 business hours for a response.  Please remember that this is for non-urgent requests.   Thank you for entrusting me with your care and for choosing [facility], Dr. [name]

## 2022-03-11 NOTE — Patient Instructions (Signed)
Visit Information   Thank you for taking time to visit with me today. Please don't hesitate to contact me if I can be of assistance to you before our next scheduled telephone appointment.  Following are the goals we discussed today:  Patient Goals/Self-Care Activities Over the next 7 days, patient will:  take medications as prescribed, check glucose daily, document, and provide at future appointments, check blood pressure daily, document, and provide at future appointments, and engage in dietary modifications by reducing sugary drinks  Follow Up Plan: Face to Face appointment with care management team member scheduled for: 1 week   Please call the care guide team at [phone] if you need to cancel or reschedule your appointment.    Following is a copy of your full care plan:  Care Plan : Medication Management  Updates made by Darius Bump, RPH since 03/11/2022 12:00 AM     Problem: DM, HTN, HLD      Long-Range Goal: Disease Progression Prevention   Start Date: 03/11/2022  This Visit's Progress: On track  Priority: High  Note:   Current Barriers:  Unable to independently afford treatment regimen Unable to achieve control of diabetes   Pharmacist Clinical Goal(s):  Over the next 7 days, patient will adhere to plan to optimize therapeutic regimen for diabetes as evidenced by report of adherence to recommended medication management changes through collaboration with PharmD and provider.   Interventions: 1:1 collaboration with [name], NP regarding development and update of comprehensive plan of care as evidenced by provider attestation and co-signature Inter-disciplinary care team collaboration (see longitudinal plan of care) Comprehensive medication review performed; medication list updated in electronic medical record  Diabetes:  Diagnosed with PCOS at 46 yr old. Had been borderline diabetes for many years. Metformin for several years now. Cannot recall what happened with  trying other medications  Uncontrolled; current treatment:metformin '500mg'$  BID, glipizide '5mg'$  BID; a1c 12.8, highest its ever been for her.  Current glucose readings: fasting glucose: 208, post prandial glucose: 312, 415, 500 in office  Reports hypoglycemic/hyperglycemic symptoms  Current meal patterns: breakfast: to be discussed at future visits  Current exercise: to be discussed at future visits  Counseled on goals for glucose, C1K, and implications of uncontrolled DM, long-term sequelae Recommended continue current regimen, patient to come on site for CGM placement and further discussion of ozempic,  Hypertension:  Uncontrolled; current treatment:recently was switched to valsartan-hctz 160-'25mg'$  daily at last PCP visit;   Current home readings: to be discussed at future visits  Denies hypotensive/hypertensive symptoms  Recommended continue current regimen since medication was recently adjusted Hyperlipidemia:  Uncontrolled; current treatment:crestor '20mg'$  daily (dose increased 11/2021);   Medications previously tried: atorvastatin - diarrhea   Recommended continue current regimen, due for lipid panel reassessment since increasing crestor  Patient Goals/Self-Care Activities Over the next 7 days, patient will:  take medications as prescribed, check glucose daily, document, and provide at future appointments, check blood pressure daily, document, and provide at future appointments, and engage in dietary modifications by reducing sugary drinks  Follow Up Plan: Face to Face appointment with care management team member scheduled for: 1 week       Patient verbalizes understanding of instructions and care plan provided today and agrees to view in [place]. Active MyChart status and patient understanding of how to access instructions and care plan via MyChart confirmed with patient.

## 2022-03-11 NOTE — Progress Notes (Signed)
Chief Complaint: Gastroparesis  HPI : 46 year old female with history of DM and PCOS presents with gastroparesis  She has had issues with N&V that worsened in 12/2021. In the past she has been told that she had hiatal hernia. She may have been diagnosed with Barrett's esophagus in the past by a prior GI physician Dr. Alice Reichert. Her last EGD was done 10 years ago but it is unclear exactly what diagnoses she has been labelled with. She has had acid reflux for years ever since her 45s. She takes Nexium to help keep her GERD under control. She was diagnosed with gastroparesis by Dr. Paulita Fujita in the past after she had a gastrici emptying study. She has never been told about a gastroparesis diet. Occasionally has lower abdominal pain. Denies narcotics use. She has intermittent constipation. On average she has 1 BM once every 2-4 days. She alternates between constipation and diarrhea. When she is more constipated, she has more issues with abdominal pain. She sometimes sees dark red and bright red stools. She will see blood clots in the stools and sometimes when she wipes. She takes meloxicam PRN to help with arthritis. Denies dysphagia. Denies odynophagia. She has family history of colon polyps. Denies use of marijuana. Has occasional migraines. She is working on her diabetes control.  Wt Readings from Last 3 Encounters:  03/11/22 232 lb (105.2 kg)  03/02/22 227 lb 9.6 oz (103.2 kg)  01/13/22 228 lb (103.4 kg)   Past Medical History:  Diagnosis Date   Anemia    Anxiety    Diabetes mellitus without complication (HCC)    GERD (gastroesophageal reflux disease)    Hypercalcemia    Hypertension    Obesity    PCOS (polycystic ovarian syndrome)      History reviewed. No pertinent surgical history. Family History  Problem Relation Age of Onset   Cancer Mother        breast CA   Diabetes Mother    Colon polyps Mother    Irritable bowel syndrome Mother    Hypertension Father    Diabetes Father     Heart attack Brother 38   Diabetes Maternal Grandmother    Diabetes Maternal Grandfather    Diabetes Paternal Grandmother    Diabetes Paternal Grandfather    Social History   Tobacco Use   Smoking status: Former    Types: Cigarettes    Quit date: 06/22/2011    Years since quitting: 10.7   Smokeless tobacco: Never  Vaping Use   Vaping Use: Never used  Substance Use Topics   Alcohol use: Yes    Comment: occasionally   Drug use: Never   Current Outpatient Medications  Medication Sig Dispense Refill   ALPRAZolam (XANAX) 0.5 MG tablet Take 1 tablet (0.5 mg total) by mouth daily as needed for anxiety. To be used sparingly for severe anxiety no more than 2-3 times weekly. 15 tablet 0   clotrimazole-betamethasone (LOTRISONE) cream Apply 1 application. topically daily. 30 g 0   esomeprazole (NEXIUM) 20 MG capsule Take 1 capsule (20 mg total) by mouth daily at 12 noon. 90 capsule 1   glipiZIDE (GLUCOTROL) 5 MG tablet Take 1 tablet (5 mg total) by mouth 2 (two) times daily before a meal. 60 tablet 3   [name] 1-20 MG-MCG(24) tablet Take 1 tablet by mouth daily.     meloxicam (MOBIC) 15 MG tablet Take 1 tablet (15 mg total) by mouth daily as needed for pain. 90 tablet 1  metFORMIN (GLUCOPHAGE XR) 500 MG 24 hr tablet Take 1 tablet (500 mg total) by mouth 2 (two) times daily before a meal. 180 tablet 0   rosuvastatin (CRESTOR) 20 MG tablet TAKE 1 TABLET(20 MG) BY MOUTH DAILY 90 tablet 1   valsartan-hydrochlorothiazide (DIOVAN-HCT) 160-25 MG tablet Take 1 tablet by mouth daily. 90 tablet 3   No current facility-administered medications for this visit.   Allergies  Allergen Reactions   Sitagliptin Swelling   Amlodipine Nausea And Vomiting    Headaches    Dapagliflozin    Losartan     Dizziness, burning sensation   Metformin Nausea And Vomiting   Metoprolol Nausea And Vomiting    headache   Venlafaxine     Swets, chills, shaky   Atorvastatin Diarrhea   Clonidine Cough    Dry  throat      Review of Systems: All systems reviewed and negative except where noted in HPI.   Physical Exam: BP (!) 160/100   Pulse 96   Ht $R'5\' 5"'jU$  (1.651 m)   Wt 232 lb (105.2 kg)   BMI 38.61 kg/m  Constitutional: Pleasant,well-developed, female in no acute distress. HEENT: Normocephalic and atraumatic. Conjunctivae are normal. No scleral icterus. Cardiovascular: Normal rate, regular rhythm. Systolic murmur Pulmonary/chest: Effort normal and breath sounds normal. No wheezing, rales or rhonchi. Abdominal: Soft, nondistended, nontender. Bowel sounds active throughout. There are no masses palpable. No hepatomegaly. Extremities: No edema Neurological: Alert and oriented to person place and time. Skin: Skin is warm and dry. No rashes noted. Psychiatric: Normal mood and affect. Behavior is normal.  Labs 11/2021: CBC with low Hb of 10.4, MCV 77.4. CMP with elevated alk phos of 130, AST of 47, elevated glucose of 268.  Gastric emptying study 09/19/14: IMPRESSION: Gastric retention at 120 min is 36%. Normal retention at 120 min is less than 30%.  KUB 07/08/21: IMPRESSION: Negative.  ASSESSMENT AND PLAN: Anemia Gastroparesis GERD N&V Hematochezia Alternating constipation and diarrhea Elevated LFTs Patient presents with worsened N&V over the last 3 months. Sources of the patient's N&V are likely multifactorial. Contributors could include fluctuations in blood sugars, gastroparesis, or constipation. I recommended that the patient start a gastroparesis diet. Will also go ahead and increase her Nexium to see this helps with her symptoms. Will check for alternative causes of her N&V by performing labs, imaging, and EGD. Patient is also anemic and describes blood clots in her stools so will plan for colonoscopy for further evaluation - Encourage hydration and physical activity - Gastroparesis diet - Increase Nexium from 20 mg to 40 mg QD - Check CBC, ferritin/IBC, TSH, lipase - Check  RUQ U/S - EGD/colonoscopy [facility] - Can consider further work up for her elevated LFTs in the future  [name], MD  I spent 65 minutes of time, including in depth chart review, independent review of results as outlined above, communicating results with the patient directly, face-to-face time with the patient, coordinating care, ordering studies and medications as appropriate, and documentation.

## 2022-03-14 ENCOUNTER — Other Ambulatory Visit: Payer: Self-pay

## 2022-03-14 ENCOUNTER — Encounter: Payer: Self-pay | Admitting: Internal Medicine

## 2022-03-14 LAB — IBC + FERRITIN
Ferritin: 5 ng/mL — ABNORMAL LOW (ref 10.0–291.0)
Iron: 21 ug/dL — ABNORMAL LOW (ref 42–145)
Saturation Ratios: 3.4 % — ABNORMAL LOW (ref 20.0–50.0)
TIBC: 613.2 ug/dL — ABNORMAL HIGH (ref 250.0–450.0)
Transferrin: 438 mg/dL — ABNORMAL HIGH (ref 212.0–360.0)

## 2022-03-14 LAB — TSH: TSH: 5.28 u[IU]/mL (ref 0.35–5.50)

## 2022-03-14 MED ORDER — FERROUS SULFATE 325 (65 FE) MG PO TBEC: 325.0000 mg | DELAYED_RELEASE_TABLET | Freq: Every day | ORAL | 6 refills | Status: DC

## 2022-03-15 MED ORDER — FERROUS SULFATE 325 (65 FE) MG PO TBEC: 325.0000 mg | DELAYED_RELEASE_TABLET | Freq: Every day | ORAL | 6 refills | Status: DC

## 2022-03-16 ENCOUNTER — Ambulatory Visit: Payer: BC Managed Care – PPO | Admitting: Pharmacist

## 2022-03-16 ENCOUNTER — Ambulatory Visit: Payer: BC Managed Care – PPO

## 2022-03-16 DIAGNOSIS — E1169 Type 2 diabetes mellitus with other specified complication: Secondary | ICD-10-CM

## 2022-03-16 DIAGNOSIS — E1165 Type 2 diabetes mellitus with hyperglycemia: Secondary | ICD-10-CM

## 2022-03-16 DIAGNOSIS — I10 Essential (primary) hypertension: Secondary | ICD-10-CM

## 2022-03-16 NOTE — Progress Notes (Signed)
Care Management   Pharmacy Note  03/16/2022 Name: Lindsey Maxwell MRN: [id number] DOB: 11-24-1975  Summary:  Addressed HTN, HLD, and primarily DM. Today we reviewed continuous glucose monitoring.  Recommendations: Continue current regimen for now, applied CGM sensor, counseled on technique of sensor and functionality of mobile app.  Follow Up: f/u with pharmacist in 1.5 weeks to review CGM data.  Subjective: Lindsey Maxwell is a 46 y.o. year old female who is a primary care patient of [name], NP. The Care Management team was consulted for assistance with care management and care coordination needs.    Engaged with patient by telephone for follow up visit in response to provider referral for pharmacy case management and/or care coordination services.   The patient was given information about Care Management services today including:  Care Management services includes personalized support from designated clinical staff supervised by the patient's primary care provider, including individualized plan of care and coordination with other care providers. 24/7 contact phone numbers for assistance for urgent and routine care needs. The patient may stop case management services at any time by phone call to the office staff.  Patient agreed to services and consent obtained.  Assessment:  Review of patient status, including review of consultants reports, laboratory and other test data, was performed as part of comprehensive evaluation and provision of chronic care management services.   SDOH (Social Determinants of Health) assessments and interventions performed:    Objective:  Lab Results  Component Value Date   CREATININE 0.61 12/01/2021   CREATININE 0.82 09/23/2021   CREATININE 0.57 08/16/2021    Lab Results  Component Value Date   HGBA1C 12.8 (A) 03/02/2022       Component Value Date/Time   CHOL 190 12/01/2021 0000   TRIG 441 (H) 12/01/2021 0000   HDL 29 (L) 12/01/2021 0000    CHOLHDL 6.6 (H) 12/01/2021 0000   LDLCALC  12/01/2021 0000     Comment:     . LDL cholesterol not calculated. Triglyceride levels greater than 400 mg/dL invalidate calculated LDL results. . Reference range: <100 . Desirable range <100 mg/dL for primary prevention;   <70 mg/dL for patients with CHD or diabetic patients  with > or = 2 CHD risk factors. Marland Kitchen LDL-C is now calculated using the Martin-Hopkins  calculation, which is a validated novel method providing  better accuracy than the Friedewald equation in the  estimation of LDL-C.  Cresenciano Genre et al. Annamaria Helling. 8891;694(50): 2061-2068  (http://education.Quest[employer].com/faq/FAQ164)    LDLDIRECT 100 (H) 12/01/2021 0000     BP Readings from Last 3 Encounters:  03/11/22 (!) 160/100  03/02/22 (!) 144/84  01/13/22 (!) 146/78    Care Plan  Allergies  Allergen Reactions   Sitagliptin Swelling   Amlodipine Nausea And Vomiting    Headaches    Dapagliflozin    Losartan     Dizziness, burning sensation   Metformin Nausea And Vomiting   Metoprolol Nausea And Vomiting    headache   Venlafaxine     Swets, chills, shaky   Atorvastatin Diarrhea   Clonidine Cough    Dry throat     Medications Reviewed Today     Reviewed by Agustin Cree, CMA (Certified Medical Assistant) on 03/11/22 at [place] List Status: <None>   Medication Order Taking? Sig Documenting Provider Last Dose Status Informant  ALPRAZolam (XANAX) 0.5 MG tablet [id number]  Take 1 tablet (0.5 mg total) by mouth daily as needed for anxiety. To be used sparingly for  severe anxiety no more than 2-3 times weekly. [name], NP  Active   clotrimazole-betamethasone (LOTRISONE) cream [id number]  Apply 1 application. topically daily. [name], NP  Active   esomeprazole (NEXIUM) 20 MG capsule [id number]  Take 1 capsule (20 mg total) by mouth daily at 12 noon. [name], NP  Active   glipiZIDE (GLUCOTROL) 5 MG tablet [id number]  Take 1 tablet (5 mg total) by mouth  2 (two) times daily before a meal. Jessup, Joy, NP  Active   [name] 1-20 MG-MCG(24) tablet [id number]  Take 1 tablet by mouth daily. [provider]  Active   meloxicam (MOBIC) 15 MG tablet [id number]  Take 1 tablet (15 mg total) by mouth daily as needed for pain. [name], NP  Active   metFORMIN (GLUCOPHAGE XR) 500 MG 24 hr tablet [id number]  Take 1 tablet (500 mg total) by mouth 2 (two) times daily before a meal. [name], NP  Active   rosuvastatin (CRESTOR) 20 MG tablet [id number]  TAKE 1 TABLET(20 MG) BY MOUTH DAILY [name], NP  Active   valsartan-hydrochlorothiazide (DIOVAN-HCT) 160-25 MG tablet [id number]  Take 1 tablet by mouth daily. [name], NP  Active             Patient Active Problem List   Diagnosis Date Noted   Uncontrolled type 2 diabetes mellitus with hyperglycemia, without long-term current use of insulin ([place]) 03/02/2022   Gastroparesis 03/02/2022   Dysuria 03/02/2022   History of anemia 03/18/2020   Morbid obesity ([place]) 09/14/2018   Vitamin B 12 deficiency 01/22/2018   Vitamin D deficiency 01/22/2018   Tobacco use disorder 12/28/2016   Microalbuminuria 38/88/2800   Neutrophilic leukocytosis 34/91/7915   Thrombocytosis 11/22/2016   Hyperlipidemia associated with type 2 diabetes mellitus ([place]) 03/19/2015   OSA (obstructive sleep apnea) 08/15/2014   Anxiety state 12/30/2013   Essential hypertension 12/30/2013   Gastroesophageal reflux disease without esophagitis 11/19/2013   Hirsutism 11/19/2013   Insomnia 11/19/2013   Polycystic ovarian syndrome 11/19/2013    Conditions to be addressed/monitored: HTN, HLD, and DMII  There are no care plans that you recently modified to display for this patient.   Medication Assistance:   TBD  Follow Up:  Patient agrees to Care Plan and Follow-up.  Plan: Telephone follow up appointment with care management team member scheduled for:  1.5 months  Larinda Buttery, PharmD Clinical Pharmacist [facility] [phone]

## 2022-03-17 NOTE — Patient Instructions (Signed)
Visit Information  Thank you for taking time to visit with me today. Please don't hesitate to contact me if I can be of assistance to you before our next scheduled telephone appointment.  Following are the goals we discussed today:  Patient Goals/Self-Care Activities Over the next 7 days, patient will:  take medications as prescribed, utilize CGM device, check blood pressure daily, document, and provide at future appointments, and engage in dietary modifications by reducing sugary drinks  Follow Up Plan: Telephon appointment with care management team member scheduled for: 1.5 weeks  Please call the care guide team at [phone] if you need to cancel or reschedule your appointment.    Patient verbalizes understanding of instructions and care plan provided today and agrees to view in [place]. Active MyChart status and patient understanding of how to access instructions and care plan via MyChart confirmed with patient.     Lindsey Maxwell

## 2022-03-25 ENCOUNTER — Other Ambulatory Visit: Payer: Self-pay

## 2022-03-25 ENCOUNTER — Ambulatory Visit: Payer: BC Managed Care – PPO | Admitting: Pharmacist

## 2022-03-25 ENCOUNTER — Ambulatory Visit (HOSPITAL_COMMUNITY)
Admission: RE | Admit: 2022-03-25 | Discharge: 2022-03-25 | Disposition: A | Discharge status: Home / Self Care | Payer: BC Managed Care – PPO | Source: Ambulatory Visit | Attending: Internal Medicine | Admitting: Internal Medicine

## 2022-03-25 DIAGNOSIS — R7989 Other specified abnormal findings of blood chemistry: Secondary | ICD-10-CM

## 2022-03-25 DIAGNOSIS — I10 Essential (primary) hypertension: Secondary | ICD-10-CM

## 2022-03-25 DIAGNOSIS — K921 Melena: Secondary | ICD-10-CM | POA: Diagnosis present

## 2022-03-25 DIAGNOSIS — K769 Liver disease, unspecified: Secondary | ICD-10-CM

## 2022-03-25 DIAGNOSIS — D649 Anemia, unspecified: Secondary | ICD-10-CM | POA: Diagnosis present

## 2022-03-25 DIAGNOSIS — R112 Nausea with vomiting, unspecified: Secondary | ICD-10-CM | POA: Insufficient documentation

## 2022-03-25 DIAGNOSIS — R198 Other specified symptoms and signs involving the digestive system and abdomen: Secondary | ICD-10-CM | POA: Insufficient documentation

## 2022-03-25 DIAGNOSIS — K219 Gastro-esophageal reflux disease without esophagitis: Secondary | ICD-10-CM | POA: Insufficient documentation

## 2022-03-25 DIAGNOSIS — E1165 Type 2 diabetes mellitus with hyperglycemia: Secondary | ICD-10-CM

## 2022-03-25 DIAGNOSIS — K3184 Gastroparesis: Secondary | ICD-10-CM | POA: Insufficient documentation

## 2022-03-25 DIAGNOSIS — E1169 Type 2 diabetes mellitus with other specified complication: Secondary | ICD-10-CM

## 2022-03-25 NOTE — Patient Instructions (Signed)
Visit Information  Thank you for taking time to visit with me today. Please don't hesitate to contact me if I can be of assistance to you before our next scheduled telephone appointment.  Following are the goals we discussed today:  Patient Goals/Self-Care Activities Over the next 90 days, patient will:  take medications as prescribed, check blood sugar & check blood pressure daily, document, and provide at future appointments, and engage in dietary modifications by reducing sugary drinks  Follow Up Plan: Telephone appointment with care management team member scheduled for:  3 months  Please call the care guide team at [phone] if you need to cancel or reschedule your appointment.    Patient verbalizes understanding of instructions and care plan provided today and agrees to view in [place]. Active MyChart status and patient understanding of how to access instructions and care plan via MyChart confirmed with patient.     Lindsey Maxwell

## 2022-03-25 NOTE — Progress Notes (Signed)
Care Management   Pharmacy Note  03/25/2022 Name: Lindsey Maxwell MRN: [id number] DOB: [birth year]  Summary:  Addressed HTN, HLD, and primarily DM. Today we reviewed ~ 1.5 weeks of data from continuous glucose monitoring.  She states there were several times it notified as "errors." She is noticing higher numbers in evening or at night. Doesn't want to proceed with ongoing CGM at this time, due to cost.  Date of Download: 03/25/22 % Time CGM is active: 65% Average Glucose: 208 mg/dL Glucose Management Indicator: 8.3  Glucose Variability: 18% (goal <36%) Time in Goal:  - Time in range 70-180: 25% - Time above range: 75% - Time below range: 0% Observed patterns: 8-10 pm highest  Overall, she continues to make changes with her sodas and incorporates water in between soda drinks. She also has modified her habits around foods such as potatoes & chocolate.  Recommendations: Continue current regimen for now, counseled again on benefits of GLP1 therapy but declines at this time - will continue to have ongoing discussion.  Follow Up: f/u with pharmacist in 3 months after PCP visit & updated a1c.  Subjective: Lindsey Maxwell is a 46 y.o. year old female who is a primary care patient of Lindsey Bouche, NP. The Care Management team was consulted for assistance with care management and care coordination needs.    Engaged with patient by telephone for follow up visit in response to provider referral for pharmacy case management and/or care coordination services.   The patient was given information about Care Management services today including:  Care Management services includes personalized support from designated clinical staff supervised by the patient's primary care provider, including individualized plan of care and coordination with other care providers. 24/7 contact phone numbers for assistance for urgent and routine care needs. The patient may stop case management services at any time by phone call to  the office staff.  Patient agreed to services and consent obtained.  Assessment:  Review of patient status, including review of consultants reports, laboratory and other test data, was performed as part of comprehensive evaluation and provision of chronic care management services.   SDOH (Social Determinants of Health) assessments and interventions performed:    Objective:  Lab Results  Component Value Date   CREATININE 0.61 12/01/2021   CREATININE 0.82 09/23/2021   CREATININE 0.57 08/16/2021    Lab Results  Component Value Date   HGBA1C 12.8 (A) 03/02/2022       Component Value Date/Time   CHOL 190 12/01/2021 0000   TRIG 441 (H) 12/01/2021 0000   HDL 29 (L) 12/01/2021 0000   CHOLHDL 6.6 (H) 12/01/2021 0000   LDLCALC  12/01/2021 0000     Comment:     . LDL cholesterol not calculated. Triglyceride levels greater than 400 mg/dL invalidate calculated LDL results. . Reference range: <100 . Desirable range <100 mg/dL for primary prevention;   <70 mg/dL for patients with CHD or diabetic patients  with > or = 2 CHD risk factors. Marland Kitchen LDL-C is now calculated using the Martin-Hopkins  calculation, which is a validated novel method providing  better accuracy than the Friedewald equation in the  estimation of LDL-C.  Cresenciano Genre et al. Annamaria Helling. 0355;974(16): 2061-2068  (http://education.[facility].com/faq/FAQ164)    LDLDIRECT 100 (H) 12/01/2021 0000     BP Readings from Last 3 Encounters:  03/11/22 (!) 160/100  03/02/22 (!) 144/84  01/13/22 (!) 146/78    Care Plan  Allergies  Allergen Reactions   Sitagliptin Swelling  Amlodipine Nausea And Vomiting    Headaches    Dapagliflozin    Losartan     Dizziness, burning sensation   Metformin Nausea And Vomiting   Metoprolol Nausea And Vomiting    headache   Venlafaxine     Swets, chills, shaky   Atorvastatin Diarrhea   Clonidine Cough    Dry throat     Medications Reviewed Today     Reviewed by Darius Bump, [facility] (Pharmacist) on 03/25/22 at 1353  Med List Status: <None>   Medication Order Taking? Sig Documenting Provider Last Dose Status Informant  ALPRAZolam (XANAX) 0.5 MG tablet [id number] Yes Take 1 tablet (0.5 mg total) by mouth daily as needed for anxiety. To be used sparingly for severe anxiety no more than 2-3 times weekly. Lindsey Bouche, NP Taking Active   clotrimazole-betamethasone (LOTRISONE) cream [id number] Yes Apply 1 application. topically daily. Lindsey Bouche, NP Taking Active   esomeprazole (NEXIUM) 40 MG capsule [id number] Yes Take 1 capsule (40 mg total) by mouth daily at 12 noon. Sharyn Creamer, MD Taking Active   ferrous sulfate 325 (65 FE) MG EC tablet [id number] Yes Take 1 tablet (325 mg total) by mouth daily with breakfast. Sharyn Creamer, MD Taking Active   glipiZIDE (GLUCOTROL) 5 MG tablet [id number] Yes Take 1 tablet (5 mg total) by mouth 2 (two) times daily before a meal. Lindsey Bouche, NP Taking Active   [name] 1-20 MG-MCG(24) tablet [id number] Yes Take 1 tablet by mouth daily. [provider] Taking Active   meloxicam (MOBIC) 15 MG tablet [id number] Yes Take 1 tablet (15 mg total) by mouth daily as needed for pain. Lindsey Bouche, NP Taking Active   metFORMIN (GLUCOPHAGE XR) 500 MG 24 hr tablet [id number] Yes Take 1 tablet (500 mg total) by mouth 2 (two) times daily before a meal. Lindsey Bouche, NP Taking Active   rosuvastatin (CRESTOR) 20 MG tablet [id number] Yes TAKE 1 TABLET(20 MG) BY MOUTH DAILY Lindsey Bouche, NP Taking Active   valsartan-hydrochlorothiazide (DIOVAN-HCT) 160-25 MG tablet [id number] Yes Take 1 tablet by mouth daily. Lindsey Bouche, NP Taking Active             Patient Active Problem List   Diagnosis Date Noted   Uncontrolled type 2 diabetes mellitus with hyperglycemia, without long-term current use of insulin ([place]) 03/02/2022   Gastroparesis 03/02/2022   Dysuria 03/02/2022   History of anemia 03/18/2020   Morbid obesity ([place]) 09/14/2018   Vitamin  B 12 deficiency 01/22/2018   Vitamin D deficiency 01/22/2018   Tobacco use disorder 12/28/2016   Microalbuminuria 54/65/0354   Neutrophilic leukocytosis 65/68/1275   Thrombocytosis 11/22/2016   Hyperlipidemia associated with type 2 diabetes mellitus ([place]) 03/19/2015   OSA (obstructive sleep apnea) 08/15/2014   Anxiety state 12/30/2013   Essential hypertension 12/30/2013   Gastroesophageal reflux disease without esophagitis 11/19/2013   Hirsutism 11/19/2013   Insomnia 11/19/2013   Polycystic ovarian syndrome 11/19/2013    Conditions to be addressed/monitored: HTN, HLD, and DMII  Care Plan : Medication Management  Updates made by Darius Bump, [place] since 03/25/2022 12:00 AM     Problem: DM, HTN, HLD      Long-Range Goal: Disease Progression Prevention   Start Date: 03/11/2022  Recent Progress: On track  Priority: High  Note:   Current Barriers:  Unable to independently afford treatment regimen Unable to achieve control of diabetes   Pharmacist Clinical Goal(s):  Over the next 90 days, patient will adhere to plan  to optimize therapeutic regimen for diabetes as evidenced by report of adherence to recommended medication management changes through collaboration with PharmD and provider.   Interventions: 1:1 collaboration with Lindsey Bouche, NP regarding development and update of comprehensive plan of care as evidenced by provider attestation and co-signature Inter-disciplinary care team collaboration (see longitudinal plan of care) Comprehensive medication review performed; medication list updated in electronic medical record  Diabetes:  Diagnosed with PCOS at 46 yr old. Had been borderline diabetes for many years. Metformin for several years now. Cannot recall what happened with trying other medications  Uncontrolled; current treatment:metformin '500mg'$  BID, glipizide '5mg'$  BID; a1c 12.8, highest its ever been for her.  Current glucose readings: fasting glucose: 208, post prandial  glucose: 312, 415, 500 in office  Reports hypoglycemic/hyperglycemic symptoms  Current meal patterns: breakfast: to be discussed at future visits  Current exercise: to be discussed at future visits  Counseled on goals for glucose, G2B, and implications of uncontrolled DM, long-term sequelae Recommended continue current regimen, patient to come on site for CGM placement and further discussion of ozempic,  Hypertension:  Uncontrolled; current treatment:recently was switched to valsartan-hctz 160-'25mg'$  daily at last PCP visit;   Current home readings: to be discussed at future visits  Denies hypotensive/hypertensive symptoms  Recommended continue current regimen since medication was recently adjusted Hyperlipidemia:  Uncontrolled; current treatment:crestor '20mg'$  daily (dose increased 11/2021);   Medications previously tried: atorvastatin - diarrhea   Recommended continue current regimen, due for lipid panel reassessment since increasing crestor  Patient Goals/Self-Care Activities Over the next 90 days, patient will:  take medications as prescribed, check blood sugar & check blood pressure daily, document, and provide at future appointments, and engage in dietary modifications by reducing sugary drinks  Follow Up Plan: Telephone appointment with care management team member scheduled for:  3 months     Medication Assistance:   TBD  Follow Up:  Patient agrees to Care Plan and Follow-up.  Plan: Telephone follow up appointment with care management team member scheduled for:  3 months  Larinda Buttery, PharmD Clinical Pharmacist [facility] [phone]

## 2022-04-05 ENCOUNTER — Ambulatory Visit
Admission: RE | Admit: 2022-04-05 | Discharge: 2022-04-05 | Disposition: A | Discharge status: Home / Self Care | Payer: BC Managed Care – PPO | Source: Ambulatory Visit | Attending: Internal Medicine | Admitting: Internal Medicine

## 2022-04-05 DIAGNOSIS — R7989 Other specified abnormal findings of blood chemistry: Secondary | ICD-10-CM

## 2022-04-05 DIAGNOSIS — K769 Liver disease, unspecified: Secondary | ICD-10-CM

## 2022-04-05 MED ORDER — GADOBENATE DIMEGLUMINE 529 MG/ML IV SOLN
20.0000 mL | Freq: Once | INTRAVENOUS | Status: AC | PRN
  Administered 2022-04-05: 20 mL via INTRAVENOUS

## 2022-04-06 ENCOUNTER — Encounter: Payer: Self-pay | Admitting: Internal Medicine

## 2022-04-15 ENCOUNTER — Encounter: Payer: Self-pay | Admitting: Internal Medicine

## 2022-04-22 ENCOUNTER — Encounter: Payer: BC Managed Care – PPO | Admitting: Internal Medicine

## 2022-05-07 ENCOUNTER — Other Ambulatory Visit: Payer: Self-pay | Admitting: Medical-Surgical

## 2022-05-20 ENCOUNTER — Other Ambulatory Visit: Payer: Self-pay

## 2022-05-25 ENCOUNTER — Other Ambulatory Visit: Payer: Self-pay | Admitting: Medical-Surgical

## 2022-05-25 NOTE — Telephone Encounter (Signed)
Last filled 12/21/2021  Last office visit 03/02/2022  Future appointment 06/02/2022 with you

## 2022-05-26 ENCOUNTER — Other Ambulatory Visit (HOSPITAL_COMMUNITY): Payer: Self-pay

## 2022-05-26 MED ORDER — ALPRAZOLAM 0.5 MG PO TABS
0.5000 mg | ORAL_TABLET | Freq: Every day | ORAL | 0 refills | Status: DC | PRN
  Filled 2022-05-26: qty 15, 35d supply, fill #0

## 2022-06-02 ENCOUNTER — Ambulatory Visit: Payer: BC Managed Care – PPO | Admitting: Medical-Surgical

## 2022-06-15 ENCOUNTER — Telehealth: Payer: BC Managed Care – PPO

## 2022-06-15 ENCOUNTER — Ambulatory Visit (AMBULATORY_SURGERY_CENTER): Payer: BC Managed Care – PPO | Admitting: Internal Medicine

## 2022-06-15 ENCOUNTER — Encounter: Payer: Self-pay | Admitting: Internal Medicine

## 2022-06-15 VITALS — BP 131/66 | HR 78 | Temp 98.6°F | Resp 17 | Ht 65.0 in | Wt 232.0 lb

## 2022-06-15 DIAGNOSIS — K635 Polyp of colon: Secondary | ICD-10-CM | POA: Diagnosis not present

## 2022-06-15 DIAGNOSIS — R198 Other specified symptoms and signs involving the digestive system and abdomen: Secondary | ICD-10-CM

## 2022-06-15 DIAGNOSIS — D649 Anemia, unspecified: Secondary | ICD-10-CM

## 2022-06-15 DIAGNOSIS — K649 Unspecified hemorrhoids: Secondary | ICD-10-CM

## 2022-06-15 DIAGNOSIS — D123 Benign neoplasm of transverse colon: Secondary | ICD-10-CM

## 2022-06-15 DIAGNOSIS — K2950 Unspecified chronic gastritis without bleeding: Secondary | ICD-10-CM | POA: Diagnosis not present

## 2022-06-15 DIAGNOSIS — R112 Nausea with vomiting, unspecified: Secondary | ICD-10-CM | POA: Diagnosis not present

## 2022-06-15 DIAGNOSIS — K921 Melena: Secondary | ICD-10-CM | POA: Diagnosis present

## 2022-06-15 DIAGNOSIS — K449 Diaphragmatic hernia without obstruction or gangrene: Secondary | ICD-10-CM | POA: Diagnosis not present

## 2022-06-15 DIAGNOSIS — K317 Polyp of stomach and duodenum: Secondary | ICD-10-CM

## 2022-06-15 DIAGNOSIS — R12 Heartburn: Secondary | ICD-10-CM

## 2022-06-15 DIAGNOSIS — K5730 Diverticulosis of large intestine without perforation or abscess without bleeding: Secondary | ICD-10-CM | POA: Diagnosis not present

## 2022-06-15 MED ORDER — HYDROCORTISONE (PERIANAL) 2.5 % EX CREA: 1.0000 | TOPICAL_CREAM | Freq: Two times a day (BID) | CUTANEOUS | 1 refills | Status: AC

## 2022-06-15 MED ORDER — SODIUM CHLORIDE 0.9 % IV SOLN: 500.0000 mL | Freq: Once | INTRAVENOUS | Status: DC

## 2022-06-15 MED ORDER — ESOMEPRAZOLE MAGNESIUM 40 MG PO CPDR: 40.0000 mg | DELAYED_RELEASE_CAPSULE | Freq: Two times a day (BID) | ORAL | 0 refills | Status: DC

## 2022-06-15 NOTE — Patient Instructions (Signed)
Handouts provided about polyps, hiatal hernia, and hemorrhoids.  Await pathology results.  Start Nexium 40 mg oral twice a day for 8 weeks as a trial.  Anusol HC cream twice a day for 7 days.  Return to GI clinic in 6 weeks.  YOU HAD AN ENDOSCOPIC PROCEDURE TODAY AT [facility]:   Refer to the procedure report that was given to you for any specific questions about what was found during the examination.  If the procedure report does not answer your questions, please call your gastroenterologist to clarify.  If you requested that your care partner not be given the details of your procedure findings, then the procedure report has been included in a sealed envelope for you to review at your convenience later.  YOU SHOULD EXPECT: Some feelings of bloating in the abdomen. Passage of more gas than usual.  Walking can help get rid of the air that was put into your GI tract during the procedure and reduce the bloating. If you had a lower endoscopy (such as a colonoscopy or flexible sigmoidoscopy) you may notice spotting of blood in your stool or on the toilet paper. If you underwent a bowel prep for your procedure, you may not have a normal bowel movement for a few days.  Please Note:  You might notice some irritation and congestion in your nose or some drainage.  This is from the oxygen used during your procedure.  There is no need for concern and it should clear up in a day or so.  SYMPTOMS TO REPORT IMMEDIATELY:  Following lower endoscopy (colonoscopy or flexible sigmoidoscopy):  Excessive amounts of blood in the stool  Significant tenderness or worsening of abdominal pains  Swelling of the abdomen that is new, acute  Fever of 100F or higher  Following upper endoscopy (EGD)  Vomiting of blood or coffee ground material  New chest pain or pain under the shoulder blades  Painful or persistently difficult swallowing  New shortness of breath  Fever of 100F or higher  Black,  tarry-looking stools  For urgent or emergent issues, a gastroenterologist can be reached at any hour by calling [phone]. Do not use MyChart messaging for urgent concerns.    DIET:  We do recommend a small meal at first, but then you may proceed to your regular diet.  Drink plenty of fluids but you should avoid alcoholic beverages for 24 hours.  ACTIVITY:  You should plan to take it easy for the rest of today and you should NOT DRIVE or use heavy machinery until tomorrow (because of the sedation medicines used during the test).    FOLLOW UP: Our staff will call the number listed on your records the next business day following your procedure.  We will call around 7:15- 8:00 am to check on you and address any questions or concerns that you may have regarding the information given to you following your procedure. If we do not reach you, we will leave a message.  If you develop any symptoms (ie: fever, flu-like symptoms, shortness of breath, cough etc.) before then, please call [phone].  If you test positive for Covid 19 in the 2 weeks post procedure, please call and report this information to [place].    If any biopsies were taken you will be contacted by phone or by letter within the next 1-3 weeks.  Please call us at [phone] if you have not heard about the biopsies in 3 weeks.    SIGNATURES/CONFIDENTIALITY:  You and/or your care partner have signed paperwork which will be entered into your electronic medical record.  These signatures attest to the fact that that the information above on your After Visit Summary has been reviewed and is understood.  Full responsibility of the confidentiality of this discharge information lies with you and/or your care-partner.

## 2022-06-15 NOTE — Op Note (Signed)
Lindsey Maxwell Patient Name: Lindsey Maxwell Procedure Date: 06/15/2022 9:35 AM MRN: [id number] Endoscopist: Sonny Masters "[name] ,  Age: 46 Referring MD:  Date of Birth: 1976/02/18 Gender: Female Account #: [id number] Procedure:                Colonoscopy Indications:              Hematochezia Medicines:                Monitored Anesthesia Care Procedure:                Pre-Anesthesia Assessment:                           - Prior to the procedure, a History and Physical                            was performed, and patient medications and                            allergies were reviewed. The patient's tolerance of                            previous anesthesia was also reviewed. The risks                            and benefits of the procedure and the sedation                            options and risks were discussed with the patient.                            All questions were answered, and informed consent                            was obtained. Prior Anticoagulants: The patient has                            taken no previous anticoagulant or antiplatelet                            agents. ASA Grade Assessment: II - A patient with                            mild systemic disease. After reviewing the risks                            and benefits, the patient was deemed in                            satisfactory condition to undergo the procedure.                           After obtaining informed consent, the colonoscope  was passed under direct vision. Throughout the                            procedure, the patient's blood pressure, pulse, and                            oxygen saturations were monitored continuously. The                            Olympus CF-HQ190L [id number]) Colonoscope was                            introduced through the anus and advanced to the the                            terminal ileum. The colonoscopy was performed                             without difficulty. The patient tolerated the                            procedure well. The quality of the bowel                            preparation was good. The terminal ileum, ileocecal                            valve, appendiceal orifice, and rectum were                            photographed. Scope In: 9:59:26 AM Scope Out: 10:18:39 AM Scope Withdrawal Time: 0 hours 16 minutes 9 seconds  Total Procedure Duration: 0 hours 19 minutes 13 seconds  Findings:                 The terminal ileum appeared normal.                           A 7 mm polyp was found in the transverse colon. The                            polyp was sessile. The polyp was removed with a                            cold snare. Resection and retrieval were complete.                           Multiple diverticula were found in the sigmoid                            colon.                           Non-bleeding internal hemorrhoids were found during  retroflexion. The hemorrhoids were Grade III                            (internal hemorrhoids that prolapse but require                            manual reduction).                           Biopsies were taken with a cold forceps in the                            entire colon for histology. Complications:            No immediate complications. Estimated Blood Loss:     Estimated blood loss was minimal. Impression:               - The examined portion of the ileum was normal.                           - One 7 mm polyp in the transverse colon, removed                            with a cold snare. Resected and retrieved.                           - Diverticulosis in the sigmoid colon.                           - Non-bleeding internal hemorrhoids.                           - Biopsies were taken with a cold forceps for                            histology in the entire colon. Recommendation:           - Discharge patient  to home (with escort).                           - It is suspected that your rectal bleeding is due                            to large hemorrhoids.                           - Await pathology results.                           - Return to GI clinic in 6 weeks.                           - Anusol HC cream BID for 7 days.                           - The findings and recommendations were discussed  with the patient. Dr Georgian Co "[place]" [place],  06/15/2022 10:28:38 AM

## 2022-06-15 NOTE — Progress Notes (Signed)
Report to PACU, RN, vss, BBS= Clear.  

## 2022-06-15 NOTE — Progress Notes (Signed)
GASTROENTEROLOGY PROCEDURE H&P NOTE   Primary Care Physician: [name], NP    Reason for Procedure:   Hematochezia, alternating constipation and diarrhea, N&V, GERD  Plan:    EGD/colonoscopy  Patient is appropriate for endoscopic procedure(s) in the ambulatory ([place]) setting.  The nature of the procedure, as well as the risks, benefits, and alternatives were carefully and thoroughly reviewed with the patient. Ample time for discussion and questions allowed. The patient understood, was satisfied, and agreed to proceed.     HPI: Lindsey Maxwell is a 46 y.o. female who presents for EGD/colonoscopy for evaluation of hematochezia, alternating constipation and diarrhea, N&V, and GERD .  Patient was most recently seen in the Gastroenterology Clinic on 03/11/22.  No interval change in medical history since that appointment. Please refer to that note for full details regarding GI history and clinical presentation.   Past Medical History:  Diagnosis Date   Anemia    Anxiety    Diabetes mellitus without complication (HCC)    GERD (gastroesophageal reflux disease)    Hypercalcemia    Hypertension    Obesity    PCOS (polycystic ovarian syndrome)     Past Surgical History:  Procedure Laterality Date   UPPER GASTROINTESTINAL ENDOSCOPY      Prior to Admission medications   Medication Sig Start Date End Date Taking? Authorizing Provider  esomeprazole (NEXIUM) 40 MG capsule Take 1 capsule (40 mg total) by mouth daily at 12 noon. 03/11/22  Yes Sharyn Creamer, MD  glipiZIDE (GLUCOTROL) 5 MG tablet Take 1 tablet (5 mg total) by mouth 2 (two) times daily before a meal. 03/02/22  Yes Jessup, Joy, NP  [name] 1-20 MG-MCG(24) tablet Take 1 tablet by mouth daily. 12/23/21  Yes [provider]  metFORMIN (GLUCOPHAGE-XR) 500 MG 24 hr tablet TAKE ONE TABLET BY MOUTH TWICE A DAY BEFORE A MEAL 05/09/22  Yes Jessup, Joy, NP  rosuvastatin (CRESTOR) 20 MG tablet TAKE 1 TABLET(20 MG) BY MOUTH DAILY  02/02/22  Yes Jessup, Joy, NP  valsartan-hydrochlorothiazide (DIOVAN-HCT) 160-25 MG tablet Take 1 tablet by mouth daily. 03/02/22  Yes [name], NP  ALPRAZolam Duanne Moron) 0.5 MG tablet Take 1 tablet (0.5 mg total) by mouth daily as needed for anxiety. To be used sparingly for severe anxiety no more than 2-3 times weekly. 05/26/22   [name], NP  clotrimazole-betamethasone (LOTRISONE) cream Apply 1 application. topically daily. 03/02/22   [name], NP  ferrous sulfate 325 (65 FE) MG EC tablet Take 1 tablet (325 mg total) by mouth daily with breakfast. 03/15/22   Sharyn Creamer, MD  meloxicam (MOBIC) 15 MG tablet Take 1 tablet (15 mg total) by mouth daily as needed for pain. 12/21/21   [name], NP    Current Outpatient Medications  Medication Sig Dispense Refill   esomeprazole (NEXIUM) 40 MG capsule Take 1 capsule (40 mg total) by mouth daily at 12 noon. 30 capsule 2   glipiZIDE (GLUCOTROL) 5 MG tablet Take 1 tablet (5 mg total) by mouth 2 (two) times daily before a meal. 60 tablet 3   [name] 1-20 MG-MCG(24) tablet Take 1 tablet by mouth daily.     metFORMIN (GLUCOPHAGE-XR) 500 MG 24 hr tablet TAKE ONE TABLET BY MOUTH TWICE A DAY BEFORE A MEAL 180 tablet 0   rosuvastatin (CRESTOR) 20 MG tablet TAKE 1 TABLET(20 MG) BY MOUTH DAILY 90 tablet 1   valsartan-hydrochlorothiazide (DIOVAN-HCT) 160-25 MG tablet Take 1 tablet by mouth daily. [place]  tablet 3   ALPRAZolam (XANAX) 0.5 MG tablet Take 1 tablet (0.5 mg total) by mouth daily as needed for anxiety. To be used sparingly for severe anxiety no more than 2-3 times weekly. 15 tablet 0   clotrimazole-betamethasone (LOTRISONE) cream Apply 1 application. topically daily. 30 g 0   ferrous sulfate 325 (65 FE) MG EC tablet Take 1 tablet (325 mg total) by mouth daily with breakfast. 30 tablet 6   meloxicam (MOBIC) 15 MG tablet Take 1 tablet (15 mg total) by mouth daily as needed for pain. 90 tablet 1   Current Facility-Administered Medications  Medication  Dose Route Frequency Provider Last Rate Last Admin   0.9 %  sodium chloride infusion  500 mL Intravenous Once Sharyn Creamer, MD        Allergies as of 06/15/2022 - Review Complete 06/15/2022  Allergen Reaction Noted   Sitagliptin Swelling 10/28/2015   Amlodipine Nausea And Vomiting 03/31/2015   Dapagliflozin  12/28/2016   Losartan  04/15/2014   Metformin Nausea And Vomiting 09/29/2015   Metoprolol Nausea And Vomiting 03/31/2015   Venlafaxine  04/15/2014   Atorvastatin Diarrhea 03/31/2015   Clonidine Cough 08/20/2015    Family History  Problem Relation Age of Onset   Cancer Mother        breast CA   Diabetes Mother    Colon polyps Mother    Irritable bowel syndrome Mother    Hypertension Father    Diabetes Father    Heart attack Brother 27   Diabetes Maternal Grandmother    Diabetes Maternal Grandfather    Diabetes Paternal Grandmother    Diabetes Paternal Grandfather    Colon cancer Neg Hx    Esophageal cancer Neg Hx    Rectal cancer Neg Hx    Stomach cancer Neg Hx     Social History   Socioeconomic History   Marital status: Divorced    Spouse name: Not on file   Number of children: Not on file   Years of education: Not on file   Highest education level: Not on file  Occupational History   Not on file  Tobacco Use   Smoking status: Former    Types: Cigarettes    Quit date: 06/22/2011    Years since quitting: 10.9   Smokeless tobacco: Never  Vaping Use   Vaping Use: Former  Substance and Sexual Activity   Alcohol use: Yes    Comment: occasionally   Drug use: Never   Sexual activity: Yes    Birth control/protection: OCP  Other Topics Concern   Not on file  Social History Narrative   Not on file   Social Determinants of Health   Financial Resource Strain: Not on file  Food Insecurity: Not on file  Transportation Needs: Not on file  Physical Activity: Not on file  Stress: Not on file  Social Connections: Not on file  Intimate Partner Violence: Not  on file    Physical Exam: Vital signs in last 24 hours: BP (!) 129/58   Pulse 91   Temp 98.6 F (37 C)   Ht '5\' 5"'$  (1.651 m)   Wt 232 lb (105.2 kg)   SpO2 98%   BMI 38.61 kg/m  GEN: NAD EYE: Sclerae anicteric ENT: MMM CV: Non-tachycardic Pulm: No increased WOB GI: Soft NEURO:  Alert & Oriented   [name], MD [facility]   06/15/2022 9:22 AM

## 2022-06-15 NOTE — Progress Notes (Signed)
Vs by DT  Pt's states no medical or surgical changes since previsit or office visit.  

## 2022-06-15 NOTE — Progress Notes (Signed)
Called to room to assist during endoscopic procedure.  Patient ID and intended procedure confirmed with present staff. Received instructions for my participation in the procedure from the performing physician.  

## 2022-06-15 NOTE — Op Note (Signed)
[place] Patient Name: Lindsey Maxwell Procedure Date: 06/15/2022 9:36 AM MRN: [id number] Endoscopist: Sonny Masters "Lindsey Maxwell ,  Age: 46 Referring MD:  Date of Birth: December 24, 1975 Gender: Female Account #: [id number] Procedure:                Upper GI endoscopy Indications:              Heartburn, Nausea with vomiting Medicines:                Monitored Anesthesia Care Procedure:                Pre-Anesthesia Assessment:                           - Prior to the procedure, a History and Physical                            was performed, and patient medications and                            allergies were reviewed. The patient's tolerance of                            previous anesthesia was also reviewed. The risks                            and benefits of the procedure and the sedation                            options and risks were discussed with the patient.                            All questions were answered, and informed consent                            was obtained. Prior Anticoagulants: The patient has                            taken no previous anticoagulant or antiplatelet                            agents. ASA Grade Assessment: II - A patient with                            mild systemic disease. After reviewing the risks                            and benefits, the patient was deemed in                            satisfactory condition to undergo the procedure.                           After obtaining informed consent, the endoscope was  passed under direct vision. Throughout the                            procedure, the patient's blood pressure, pulse, and                            oxygen saturations were monitored continuously. The                            Endoscope was introduced through the mouth, and                            advanced to the second part of duodenum. The upper                            GI endoscopy was  accomplished without difficulty.                            The patient tolerated the procedure well. Scope In: Scope Out: Findings:                 The examined esophagus was normal.                           A large hiatal hernia was present.                           Six 3 to 12 mm sessile polyps with no bleeding and                            no stigmata of recent bleeding were found in the                            gastric body. These polyps were removed with a cold                            snare. Resection and retrieval were complete.                           Localized mildly erythematous mucosa without                            bleeding was found in the gastric antrum. This was                            biopsied with a cold forceps for histology.                           The examined duodenum was normal. Complications:            No immediate complications. Estimated Blood Loss:     Estimated blood loss was minimal. Impression:               - Normal esophagus.                           -  Large hiatal hernia.                           - Six gastric polyps. Resected and retrieved.                           - Erythematous mucosa in the antrum. Biopsied.                           - Normal examined duodenum. Recommendation:           - Use Nexium (esomeprazole) 40 mg PO BID for 8                            weeks as a trial.                           - Await pathology results.                           - Perform a colonoscopy today. Dr Lindsey Maxwell "Lindsey Maxwell" Lindsey Maxwell,  06/15/2022 10:25:08 AM

## 2022-06-16 ENCOUNTER — Telehealth: Payer: Self-pay | Admitting: *Deleted

## 2022-06-16 NOTE — Telephone Encounter (Signed)
  Follow up Call-     06/15/2022    8:15 AM  Call back number  Post procedure Call Back phone  # [phone]  Permission to leave phone message Yes     Patient questions:  Do you have a fever, pain , or abdominal swelling? No. Pain Score  0 *  Have you tolerated food without any problems? Yes.    Have you been able to return to your normal activities? Yes.    Do you have any questions about your discharge instructions: Diet   No. Medications  No. Follow up visit  No.  Do you have questions or concerns about your Care? Rash on left hand. Pt states she "came back a up here yesterday after she ate lunch at [employer] and a female doctor looked at her hand." Pt stated that her left hand is not swollen,painful or itching, it just has a rash that has maybe gotten a little better. IV was on the right side. I advised pt if the rash became painful or swollen to see her PCP. Pt verbalized understanding.     Actions: * If pain score is 4 or above: No action needed, pain <4.

## 2022-06-17 ENCOUNTER — Telehealth: Payer: Self-pay | Admitting: Pharmacy Technician

## 2022-06-17 NOTE — Telephone Encounter (Signed)
Patient Advocate Encounter  Prior Authorization for Esomeprazole '40mg'$  has been approved.    Effective: 06-17-2022 to 06-17-2023

## 2022-06-17 NOTE — Telephone Encounter (Signed)
Patient Advocate Encounter  Received notification that prior authorization for [employer]  is required.   PA submitted on 9.8.23 Key [id number] Status is pending    Luciano Cutter, CPhT Patient Advocate Phone: [phone]

## 2022-06-20 ENCOUNTER — Encounter: Payer: Self-pay | Admitting: Internal Medicine

## 2022-06-26 ENCOUNTER — Other Ambulatory Visit: Payer: Self-pay | Admitting: Medical-Surgical

## 2022-06-29 ENCOUNTER — Ambulatory Visit (INDEPENDENT_AMBULATORY_CARE_PROVIDER_SITE_OTHER): Payer: BC Managed Care – PPO | Admitting: Medical-Surgical

## 2022-06-29 ENCOUNTER — Encounter: Payer: Self-pay | Admitting: Medical-Surgical

## 2022-06-29 VITALS — BP 143/86 | HR 77 | Temp 99.7°F | Ht 65.0 in | Wt 237.0 lb

## 2022-06-29 DIAGNOSIS — E1165 Type 2 diabetes mellitus with hyperglycemia: Secondary | ICD-10-CM | POA: Diagnosis not present

## 2022-06-29 DIAGNOSIS — F411 Generalized anxiety disorder: Secondary | ICD-10-CM | POA: Diagnosis not present

## 2022-06-29 DIAGNOSIS — E1169 Type 2 diabetes mellitus with other specified complication: Secondary | ICD-10-CM

## 2022-06-29 DIAGNOSIS — I10 Essential (primary) hypertension: Secondary | ICD-10-CM | POA: Diagnosis not present

## 2022-06-29 DIAGNOSIS — E785 Hyperlipidemia, unspecified: Secondary | ICD-10-CM

## 2022-06-29 LAB — POCT GLYCOSYLATED HEMOGLOBIN (HGB A1C): Hemoglobin A1C: 9.8 % — AB (ref 4.0–5.6)

## 2022-06-29 MED ORDER — ONDANSETRON 8 MG PO TBDP: 8.0000 mg | ORAL_TABLET | Freq: Three times a day (TID) | ORAL | 3 refills | Status: DC | PRN

## 2022-06-29 MED ORDER — GLIPIZIDE 10 MG PO TABS: 10.0000 mg | ORAL_TABLET | Freq: Two times a day (BID) | ORAL | 1 refills | Status: DC

## 2022-06-29 MED ORDER — ALPRAZOLAM 0.5 MG PO TABS: 0.5000 mg | ORAL_TABLET | Freq: Every day | ORAL | 0 refills | Status: DC | PRN

## 2022-06-29 MED ORDER — VALSARTAN-HYDROCHLOROTHIAZIDE 320-25 MG PO TABS: 1.0000 | ORAL_TABLET | Freq: Every day | ORAL | 1 refills | Status: DC

## 2022-06-29 NOTE — Progress Notes (Signed)
Established Patient Office Visit  Subjective   Patient ID: Lindsey Maxwell, female   DOB: Mar 04, 1976 Age: 46 y.o. MRN: [id number]   Chief Complaint  Patient presents with   Diabetes   Hypertension    HPI Pleasant 46 year old female presenting today for the following:  Diabetes: Taking glipizide 5 mg twice daily and metformin 500 mg twice daily.  Tolerating both medications well overall although the metformin does seem to give her some GI issues.  Has been working to be very careful with her diet.  Checking her sugars intermittently with readings in the 200s.  Notes that some of her readings are fasting and some are not.   Hypertension: Taking valsartan-HCTZ 160-25 mg daily, tolerating well without side effects.  Monitoring her blood pressure at home she has brought her readings in today.  She does have some that are at goal however majority of them are above the 130/80 recommendation.  Hyperlipidemia: Taking Crestor 20 mg daily, tolerating well without side effects.  Working to make healthier dietary choices that are lower in saturated and trans fats.   Objective:    Vitals:   06/29/22 0837 06/29/22 0857 06/29/22 0930  BP: (!) 172/99 (!) 178/101 (!) 143/86  Pulse: 75 88 77  Temp: 99.7 F (37.6 C)    Height: '5\' 5"'$  (1.651 m)    Weight: 237 lb 0.6 oz (107.5 kg)    SpO2: 98%    TempSrc: Oral    BMI (Calculated): 39.45     Physical Exam Vitals and nursing note reviewed.  Constitutional:      General: She is not in acute distress.    Appearance: Normal appearance. She is obese. She is not ill-appearing.  HENT:     Head: Normocephalic and atraumatic.  Cardiovascular:     Rate and Rhythm: Normal rate and regular rhythm.     Pulses: Normal pulses.     Heart sounds: Normal heart sounds.  Pulmonary:     Effort: Pulmonary effort is normal. No respiratory distress.     Breath sounds: Normal breath sounds. No wheezing, rhonchi or rales.  Skin:    General: Skin is warm and dry.   Neurological:     Mental Status: She is alert and oriented to person, place, and time.  Psychiatric:        Attention and Perception: Attention normal.        Mood and Affect: Mood is anxious and depressed. Affect is tearful.        Speech: Speech normal.        Behavior: Behavior normal. Behavior is cooperative.        Thought Content: Thought content normal.        Cognition and Memory: Cognition normal.        Judgment: Judgment normal.    Results for orders placed or performed in visit on 06/29/22 (from the past 24 hour(s))  POCT HgB A1C     Status: Abnormal   Collection Time: 06/29/22  9:29 AM  Result Value Ref Range   Hemoglobin A1C 9.8 (A) 4.0 - 5.6 %   HbA1c POC (<> result, manual entry)     HbA1c, POC (prediabetic range)     HbA1c, POC (controlled diabetic range)         The 10-year ASCVD risk score (Arnett DK, et al., 2019) is: 6.3%   Values used to calculate the score:     Age: 41 years     Sex: Female  Is Non-Hispanic African American: No     Diabetic: Yes     Tobacco smoker: No     Systolic Blood Pressure: 161 mmHg     Is BP treated: Yes     HDL Cholesterol: 29 mg/dL     Total Cholesterol: 190 mg/dL   Assessment & Plan:   1. Essential hypertension Checking labs as below.  Blood pressure is outside of the recommended goal here in the office as well as for most of her home readings.  Increasing valsartan-hydrochlorothiazide to 320-25 mg daily.  Continue monitoring blood pressure at home with a goal of 130/80.  She did bring her blood pressure cuff with her today and we checked it against ours.  Unfortunately, it is greater than 10 points off systolically.  Recommend getting a different blood pressure cuff however if this is not financially feasible, note that it tends to read a little higher on her cuff than with ours. - COMPLETE METABOLIC PANEL WITH GFR - Lipid Panel w/reflex Direct LDL - valsartan-hydrochlorothiazide (DIOVAN-HCT) 320-25 MG tablet; Take 1  tablet by mouth daily.  Dispense: 90 tablet; Refill: 1  2. Uncontrolled type 2 diabetes mellitus with hyperglycemia, without long-term current use of insulin (HCC) POCT hemoglobin A1c 9.8%, down from 12.8%.  Checking CMP.  Continue metformin 500 mg twice daily as prescribed.  Avoiding increase in dosing due to significant GI effects.  Increasing glipizide to 10 mg twice daily.  Advised to monitor for any hypoglycemic symptoms and check sugars regularly.  - COMPLETE METABOLIC PANEL WITH GFR - POCT HgB A1C - glipiZIDE (GLUCOTROL) 10 MG tablet; Take 1 tablet (10 mg total) by mouth 2 (two) times daily before a meal.  Dispense: 180 tablet; Refill: 1  3. Hyperlipidemia associated with type 2 diabetes mellitus ([place])  Checking labs as below.  Continue Crestor as prescribed. - COMPLETE METABOLIC PANEL WITH GFR - Lipid Panel w/reflex Direct LDL  4. Anxiety state Notes significant losses recently of 4 friends as well as her 90 year old dog who was very close to her.  She is struggling tremendously with managing grief along with her regular anxiety.  She prefers to avoid a daily medication to help manage her symptoms and is not interested in a counseling referral at this point.  Refilling her Xanax but advised to use this extremely sparingly as previously discussed.  If she changes her mind about a maintenance medication or counseling, she will let me know. - ALPRAZolam (XANAX) 0.5 MG tablet; Take 1 tablet (0.5 mg total) by mouth daily as needed for anxiety. To be used sparingly for severe anxiety no more than 2-3 times weekly.  Dispense: 15 tablet; Refill: 0  Return in about 2 weeks (around 07/13/2022) for nurse visit for BP check.  ___________________________________________ Clearnce Sorrel, DNP, APRN, FNP-BC Primary Care and [place]

## 2022-06-30 LAB — LIPID PANEL W/REFLEX DIRECT LDL
Cholesterol: 152 mg/dL (ref <200)
HDL: 28 mg/dL — ABNORMAL LOW (ref >=50)
LDL Cholesterol (Calc): 85 mg/dL (calc)
Non-HDL Cholesterol (Calc): 124 mg/dL (calc) (ref <130)
Total CHOL/HDL Ratio: 5.4 (calc) — ABNORMAL HIGH (ref <5.0)
Triglycerides: 323 mg/dL — ABNORMAL HIGH (ref <150)

## 2022-06-30 LAB — COMPLETE METABOLIC PANEL WITH GFR
AG Ratio: 1.2 (calc) (ref 1.0–2.5)
ALT: 10 U/L (ref 6–29)
AST: 20 U/L (ref 10–30)
Albumin: 4.1 g/dL (ref 3.6–5.1)
Alkaline phosphatase (APISO): 143 U/L — ABNORMAL HIGH (ref 31–125)
BUN: 11 mg/dL (ref 7–25)
CO2: 27 mmol/L (ref 20–32)
Calcium: 9.9 mg/dL (ref 8.6–10.2)
Chloride: 95 mmol/L — ABNORMAL LOW (ref 98–110)
Creat: 0.8 mg/dL (ref 0.50–0.99)
Globulin: 3.3 g/dL (calc) (ref 1.9–3.7)
Glucose, Bld: 234 mg/dL — ABNORMAL HIGH (ref 65–99)
Potassium: 3.8 mmol/L (ref 3.5–5.3)
Sodium: 136 mmol/L (ref 135–146)
Total Bilirubin: 0.2 mg/dL (ref 0.2–1.2)
Total Protein: 7.4 g/dL (ref 6.1–8.1)
eGFR: 93 mL/min/{1.73_m2} (ref >=60)

## 2022-07-12 ENCOUNTER — Encounter: Payer: Self-pay | Admitting: Medical-Surgical

## 2022-07-13 ENCOUNTER — Ambulatory Visit
Admission: RE | Admit: 2022-07-13 | Discharge: 2022-07-13 | Disposition: A | Discharge status: Home / Self Care | Payer: BC Managed Care – PPO | Source: Ambulatory Visit | Attending: Family Medicine | Admitting: Family Medicine

## 2022-07-13 VITALS — BP 171/92 | HR 89 | Temp 98.7°F | Resp 18 | Ht 65.0 in | Wt 236.0 lb

## 2022-07-13 DIAGNOSIS — G510 Bell's palsy: Secondary | ICD-10-CM

## 2022-07-13 MED ORDER — VALACYCLOVIR HCL 1 G PO TABS: 1000.0000 mg | ORAL_TABLET | Freq: Three times a day (TID) | ORAL | 0 refills | Status: DC

## 2022-07-13 MED ORDER — METHYLPREDNISOLONE 4 MG PO TABS: ORAL_TABLET | ORAL | 0 refills | Status: DC

## 2022-07-13 NOTE — Discharge Instructions (Addendum)
Take methylprednisolone 6 pills a day for 6 days.  I have given you additional methylprednisolone so that you have enough.  You are already on day 3 and have taken 4 pills with take 2 today, for tomorrow, and 5 the following day for total of 6 pills a day Take the valacyclovir antiviral medicine 3 times a day for a week Call the office of [name] today to set up an appointment for 1 day next week.  Message Joy if you are unable to get an appointment

## 2022-07-13 NOTE — ED Provider Notes (Addendum)
[facility]    CSN: [id number] Arrival date & time: 07/13/22  1017      History   Chief Complaint Chief Complaint  Patient presents with   Facial Droop    Entered by patient    HPI Lindsey Maxwell is a 46 y.o. female.   HPI  Patient went to an outside urgent care center because she had a droop on the right side of her face.  She was diagnosed with an allergic reaction.  Treated with a Medrol Dosepak.  Is here because she is feeling no better.  Unable to close right eye.  Droop on right side of mouth.  Sensation of fullness on right side of face.  No problems with vision or hearing.  No troubles with strength or coordination. She does have some stroke risk because of diabetes, hypertension, hyperlipidemia.  Non-smoker  Past Medical History:  Diagnosis Date   Anemia    Anxiety    Diabetes mellitus without complication (HCC)    GERD (gastroesophageal reflux disease)    Hypercalcemia    Hypertension    Obesity    PCOS (polycystic ovarian syndrome)     Patient Active Problem List   Diagnosis Date Noted   Uncontrolled type 2 diabetes mellitus with hyperglycemia, without long-term current use of insulin ([place]) 03/02/2022   Gastroparesis 03/02/2022   Dysuria 03/02/2022   History of anemia 03/18/2020   Morbid obesity ([place]) 09/14/2018   Vitamin B 12 deficiency 01/22/2018   Vitamin D deficiency 01/22/2018   Tobacco use disorder 12/28/2016   Microalbuminuria 30/86/5784   Neutrophilic leukocytosis 69/62/9528   Thrombocytosis 11/22/2016   Hyperlipidemia associated with type 2 diabetes mellitus ([place]) 03/19/2015   OSA (obstructive sleep apnea) 08/15/2014   Anxiety state 12/30/2013   Essential hypertension 12/30/2013   Gastroesophageal reflux disease without esophagitis 11/19/2013   Hirsutism 11/19/2013   Insomnia 11/19/2013   Polycystic ovarian syndrome 11/19/2013    Past Surgical History:  Procedure Laterality Date   UPPER GASTROINTESTINAL ENDOSCOPY      OB  History   No obstetric history on file.      Home Medications    Prior to Admission medications   Medication Sig Start Date End Date Taking? Authorizing Provider  ALPRAZolam Duanne Moron) 0.5 MG tablet Take 1 tablet (0.5 mg total) by mouth daily as needed for anxiety. To be used sparingly for severe anxiety no more than 2-3 times weekly. 06/29/22  Yes [name], NP  clotrimazole-betamethasone (LOTRISONE) cream Apply 1 application. topically daily. 03/02/22  Yes [name], NP  esomeprazole (NEXIUM) 40 MG capsule Take 1 capsule (40 mg total) by mouth 2 (two) times daily before a meal. 06/15/22 08/14/22 Yes Sharyn Creamer, MD  ferrous sulfate 325 (65 FE) MG EC tablet Take 1 tablet (325 mg total) by mouth daily with breakfast. 03/15/22  Yes Sharyn Creamer, MD  glipiZIDE (GLUCOTROL) 10 MG tablet Take 1 tablet (10 mg total) by mouth 2 (two) times daily before a meal. 06/29/22  Yes Jessup, Joy, NP  [name] 1-20 MG-MCG(24) tablet Take 1 tablet by mouth daily. 12/23/21  Yes [provider]  hydrocortisone (ANUSOL-HC) 2.5 % rectal cream Place 1 Application rectally 2 (two) times daily. 06/15/22  Yes Sharyn Creamer, MD  meloxicam (MOBIC) 15 MG tablet Take 1 tablet (15 mg total) by mouth daily as needed for pain. 12/21/21  Yes [name], NP  metFORMIN (GLUCOPHAGE-XR) 500 MG 24 hr tablet TAKE ONE TABLET BY MOUTH TWICE A DAY  BEFORE A MEAL 05/09/22  Yes [name], NP  methylPREDNISolone (MEDROL) 4 MG tablet Take as directed 07/13/22  Yes Raylene Everts, MD  ondansetron (ZOFRAN-ODT) 8 MG disintegrating tablet Take 1 tablet (8 mg total) by mouth every 8 (eight) hours as needed for nausea. 06/29/22  Yes Jessup, Caryl Asp, NP  rosuvastatin (CRESTOR) 20 MG tablet TAKE 1 TABLET(20 MG) BY MOUTH DAILY 02/02/22  Yes [name], NP  valACYclovir (VALTREX) 1000 MG tablet Take 1 tablet (1,000 mg total) by mouth 3 (three) times daily. 07/13/22  Yes Raylene Everts, MD  valsartan-hydrochlorothiazide (DIOVAN-HCT) 320-25  MG tablet Take 1 tablet by mouth daily. 06/29/22  Yes [name], NP    Family History Family History  Problem Relation Age of Onset   Cancer Mother        breast CA   Diabetes Mother    Colon polyps Mother    Irritable bowel syndrome Mother    Hypertension Father    Diabetes Father    Heart attack Brother 28   Diabetes Maternal Grandmother    Diabetes Maternal Grandfather    Diabetes Paternal Grandmother    Diabetes Paternal Grandfather    Colon cancer Neg Hx    Esophageal cancer Neg Hx    Rectal cancer Neg Hx    Stomach cancer Neg Hx     Social History Social History   Tobacco Use   Smoking status: Former    Types: Cigarettes    Quit date: 06/22/2011    Years since quitting: 11.0   Smokeless tobacco: Never  Vaping Use   Vaping Use: Former  Substance Use Topics   Alcohol use: Yes    Comment: occasionally   Drug use: Never     Allergies   Sitagliptin, Amlodipine, Dapagliflozin, Losartan, Metformin, Metoprolol, Venlafaxine, Atorvastatin, and Clonidine   Review of Systems Review of Systems See HPI  Physical Exam Triage Vital Signs ED Triage Vitals [07/13/22 1030]  Enc Vitals Group     BP (!) 171/92     Pulse Rate 89     Resp 18     Temp 98.7 F (37.1 C)     Temp Source Oral     SpO2 98 %     Weight 236 lb (107 kg)     Height '5\' 5"'$  (1.651 m)     Head Circumference      Peak Flow      Pain Score 0     Pain Loc      Pain Edu?      Excl. in [place]?    No data found.  Updated Vital Signs BP (!) 171/92 (BP Location: Right Arm)   Pulse 89   Temp 98.7 F (37.1 C) (Oral)   Resp 18   Ht '5\' 5"'$  (1.651 m)   Wt 107 kg   SpO2 98%   BMI 39.27 kg/m      Physical Exam Constitutional:      General: She is not in acute distress.    Appearance: She is well-developed.  HENT:     Head: Normocephalic and atraumatic.      Right Ear: Tympanic membrane and ear canal normal.     Left Ear: Tympanic membrane and ear canal normal.     Nose: Nose normal.      Mouth/Throat:     Mouth: Mucous membranes are moist.  Eyes:     Conjunctiva/sclera: Conjunctivae normal.     Pupils: Pupils are equal, round, and reactive to light.  Cardiovascular:     Rate and Rhythm: Normal rate.  Pulmonary:     Effort: Pulmonary effort is normal. No respiratory distress.  Abdominal:     General: There is no distension.     Palpations: Abdomen is soft.  Musculoskeletal:        General: Normal range of motion.     Cervical back: Normal range of motion.  Skin:    General: Skin is warm and dry.  Neurological:     Mental Status: She is alert.     Cranial Nerves: Cranial nerve deficit present.     Sensory: No sensory deficit.     Motor: No weakness.     Coordination: Coordination normal.     Gait: Gait normal.     Deep Tendon Reflexes: Reflexes normal.  Psychiatric:        Mood and Affect: Mood normal.        Behavior: Behavior normal.      UC Treatments / Results  Labs (all labs ordered are listed, but only abnormal results are displayed) Labs Reviewed - No data to display  EKG   Radiology No results found.  Procedures Procedures (including critical care time)  Medications Ordered in UC Medications - No data to display  Initial Impression / Assessment and Plan / UC Course  I have reviewed the triage vital signs and the nursing notes.  Pertinent labs & imaging results that were available during my care of the patient were reviewed by me and considered in my medical decision making (see chart for details).     Classic Bell's palsy.  We will treat with valacyclovir and steroids.  Follow-up with primary care Final Clinical Impressions(s) / UC Diagnoses   Final diagnoses:  Right-sided Bell's palsy     Discharge Instructions      Take methylprednisolone 6 pills a day for 6 days.  I have given you additional methylprednisolone so that you have enough.  You are already on day 3 and have taken 4 pills with take 2 today, for tomorrow, and 5 the  following day for total of 6 pills a day Take the valacyclovir antiviral medicine 3 times a day for a week Call the office of [name] today to set up an appointment for 1 day next week.  Message Joy if you are unable to get an appointment   ED Prescriptions     Medication Sig Dispense Auth. Provider   valACYclovir (VALTREX) 1000 MG tablet Take 1 tablet (1,000 mg total) by mouth 3 (three) times daily. 21 tablet Raylene Everts, MD   methylPREDNISolone (MEDROL) 4 MG tablet Take as directed 12 tablet Meda Coffee Jennette Banker, MD      PDMP not reviewed this encounter.   Raylene Everts, MD 07/13/22 1120    Raylene Everts, MD 07/13/22 1121

## 2022-07-13 NOTE — ED Triage Notes (Signed)
Patient states that she woke up Sunday and she has no movement on the right side of her face. Unable to close right eye all the way.  Was seen at an UC on Monday diagnosed with an allergic reaction and started on Prednisone.  No better.

## 2022-07-14 ENCOUNTER — Ambulatory Visit (INDEPENDENT_AMBULATORY_CARE_PROVIDER_SITE_OTHER): Payer: BC Managed Care – PPO | Admitting: Medical-Surgical

## 2022-07-14 ENCOUNTER — Telehealth: Payer: Self-pay

## 2022-07-14 VITALS — BP 125/71 | Ht 65.0 in | Wt 234.0 lb

## 2022-07-14 DIAGNOSIS — I10 Essential (primary) hypertension: Secondary | ICD-10-CM | POA: Diagnosis not present

## 2022-07-14 NOTE — Progress Notes (Signed)
Agree with documentation as below.  ___________________________________________ Brendan Gruwell L. Rutilio Yellowhair, DNP, APRN, FNP-BC Primary Care and Sports Medicine [facility]  

## 2022-07-14 NOTE — Progress Notes (Signed)
Patient is here for blood pressure check.   Previous BP was 143/86  1st BP today: 125/71  Denies chest pain, dizziness, shortness of breath, severe headache, or nosebleeds.  Taking medication as prescribed. Denies missed doses.  Pt recently diagnosed with Bell's Palsy. Following treatment plan.

## 2022-07-14 NOTE — Telephone Encounter (Signed)
TCT pt to follow up from recent visit. HIPAA compliant VM left for return call.  

## 2022-07-21 ENCOUNTER — Encounter: Payer: Self-pay | Admitting: Medical-Surgical

## 2022-07-21 ENCOUNTER — Ambulatory Visit (INDEPENDENT_AMBULATORY_CARE_PROVIDER_SITE_OTHER): Payer: BC Managed Care – PPO | Admitting: Medical-Surgical

## 2022-07-21 VITALS — BP 115/72 | HR 100 | Resp 20 | Ht 65.0 in | Wt 232.8 lb

## 2022-07-21 DIAGNOSIS — G510 Bell's palsy: Secondary | ICD-10-CM | POA: Diagnosis not present

## 2022-07-21 DIAGNOSIS — Z2821 Immunization not carried out because of patient refusal: Secondary | ICD-10-CM

## 2022-07-21 NOTE — Progress Notes (Signed)
Medical screening examination/treatment was performed by qualified nurse practitioner student and as supervising provider I was immediately available for consultation/collaboration. I have reviewed documentation and agree with assessment and plan. ° °Emileo Semel L. Margie Urbanowicz, DNP, APRN, FNP-BC °[facility] °Primary Care and Sports Medicine ° °

## 2022-07-21 NOTE — Progress Notes (Signed)
   Established Patient Office Visit  Subjective   Patient ID: Lindsey Maxwell, female    DOB: Mar 09, 1976  Age: 46 y.o. MRN: [id number]  Chief Complaint  Patient presents with   Follow-up   bells palsy    HPI  Lindsey Maxwell is here today for follow up recent visit with urgent care and was diagnose with Bells Palsy. She reports that she was prescribed and finished the course of medrol dose pack. She is also finishing 7 day course of Valacyclovir 1000 mg three times daily. She presents to clinic with persisting symptoms. She stated that the eyelid and facial droop on the right side aren't worsening but not particularly improving either. She reports that she is using some ophthalmic gel solution to help with lubrication that cause some sort of blurriness. Not currently using any eye protection at night. Does not have an established relationship with an eye doctor.  Otherwise she denies any dysphagia, no unilateral weakness of paresthesia.   Review of Systems  Constitutional: Negative.   HENT: Negative.    Eyes:  Positive for blurred vision.       She reports some dryness on right eye  Respiratory: Negative.    Cardiovascular: Negative.   Gastrointestinal:  Vomiting: associated with use of eye gels.  Skin: Negative.   Neurological:  Positive for speech change. Negative for sensory change.      Objective:     BP 115/72   Pulse 100   Resp 20   Ht '5\' 5"'$  (1.651 m)   Wt 232 lb 12.8 oz (105.6 kg)   SpO2 98%   BMI 38.74 kg/m    Physical Exam Constitutional:      General: She is not in acute distress.    Appearance: Normal appearance. She is not ill-appearing, toxic-appearing or diaphoretic.  HENT:     Head: Normocephalic and atraumatic.  Cardiovascular:     Rate and Rhythm: Normal rate and regular rhythm.  Pulmonary:     Effort: Pulmonary effort is normal.  Skin:    General: Skin is warm.     Capillary Refill: Capillary refill takes less than 2 seconds.  Neurological:     General: No  focal deficit present.     Mental Status: She is alert and oriented to person, place, and time. Mental status is at baseline.     Cranial Nerves: Cranial nerve deficit present.     Sensory: No sensory deficit.     Motor: No weakness.     Gait: Gait normal.     Comments: Right sided eye lid and nasolabial droop      No results found for any visits on 07/21/22.    The 10-year ASCVD risk score (Arnett DK, et al., 2019) is: 3%    Assessment & Plan:   1. Bell's palsy Presenting symptoms consistent with Bells Palsy. Educational materials providing regarding disease progression and ways to manage and prevent eye irritation. Continue with the remaining dose of Valacyclovir as prescribe.  Strongly recommend establishing with an eye doctor for further monitoring and management.  Would also like for her to get an eye cover or gauze to use over the eye at night for protection against corneal abrasions and irritation.  2. Influenza vaccination declined Declined at this time,  Return for Follow-up as scheduled next month.    [name], NP/Ashia Dehner, RN, NP student

## 2022-08-03 ENCOUNTER — Encounter: Payer: Self-pay | Admitting: Internal Medicine

## 2022-08-05 ENCOUNTER — Other Ambulatory Visit: Payer: Self-pay

## 2022-08-05 DIAGNOSIS — K769 Liver disease, unspecified: Secondary | ICD-10-CM

## 2022-08-09 ENCOUNTER — Ambulatory Visit (INDEPENDENT_AMBULATORY_CARE_PROVIDER_SITE_OTHER): Payer: BC Managed Care – PPO | Admitting: Internal Medicine

## 2022-08-09 ENCOUNTER — Other Ambulatory Visit (INDEPENDENT_AMBULATORY_CARE_PROVIDER_SITE_OTHER): Payer: BC Managed Care – PPO

## 2022-08-09 ENCOUNTER — Encounter: Payer: Self-pay | Admitting: Internal Medicine

## 2022-08-09 DIAGNOSIS — K769 Liver disease, unspecified: Secondary | ICD-10-CM | POA: Diagnosis not present

## 2022-08-09 DIAGNOSIS — K3184 Gastroparesis: Secondary | ICD-10-CM

## 2022-08-09 DIAGNOSIS — K219 Gastro-esophageal reflux disease without esophagitis: Secondary | ICD-10-CM

## 2022-08-09 DIAGNOSIS — R7989 Other specified abnormal findings of blood chemistry: Secondary | ICD-10-CM

## 2022-08-09 LAB — PROTIME-INR
INR: 1 ratio (ref 0.8–1.0)
Prothrombin Time: 11.1 s (ref 9.6–13.1)

## 2022-08-09 MED ORDER — ESOMEPRAZOLE MAGNESIUM 20 MG PO CPDR: 20.0000 mg | DELAYED_RELEASE_CAPSULE | Freq: Every day | ORAL | 2 refills | Status: DC

## 2022-08-09 MED ORDER — METOCLOPRAMIDE HCL 5 MG PO TABS: 5.0000 mg | ORAL_TABLET | Freq: Three times a day (TID) | ORAL | 2 refills | Status: DC | PRN

## 2022-08-09 NOTE — Patient Instructions (Addendum)
If you are age 44 or younger, your body mass index should be between 19-25. Your Body mass index is 39.48 kg/m. If this is out of the aformentioned range listed, please consider follow up with your Primary Care Provider.  ________________________________________________________  The [facility] providers would like to encourage you to use [facility] to communicate with providers for non-urgent requests or questions.  Due to long hold times on the telephone, sending your provider a message by [facility] may be a faster and more efficient way to get a response.  Please allow 48 business hours for a response.  Please remember that this is for non-urgent requests.  _______________________________________________________  Your provider has requested that you go to the basement level for lab work before leaving today. Press "B" on the elevator. The lab is located at the first door on the left as you exit the elevator.  We have sent the following medications to your pharmacy for you to pick up at your convenience:  START: Reglan '5mg'$  one tablet three times daily as needed  DECREASE: Nexium '20mg'$  once daily  Due to recent changes in healthcare laws, you may see the results of your imaging and laboratory studies on MyChart before your provider has had a chance to review them.  We understand that in some cases there may be results that are confusing or concerning to you. Not all laboratory results come back in the same time frame and the provider may be waiting for multiple results in order to interpret others.  Please give [place] 48 hours in order for your provider to thoroughly review all the results before contacting the office for clarification of your results.   We will contact you when we get the MRI scheduled at [facility]  Thank you for entrusting me with your care and choosing [facility].  Dr [name]

## 2022-08-09 NOTE — Progress Notes (Signed)
Chief Complaint: Gastroparesis  HPI : 46 year old female with history of DM and PCOS presents for follow up of gastroparesis  Interval History: The Nexium has not really been helping with her N&V. She is trying to follow the gastroparesis diet but it has been hard to follow it perfectly. She has thrown up 3-4 times over the 2 weeks. Her diabetes control has gotten better. Her HbA1C has dropped by 3%. She was on iron supplements but has since discontinued these due to issues with constipation.  Wt Readings from Last 3 Encounters:  08/09/22 237 lb 4 oz (107.6 kg)  07/21/22 232 lb 12.8 oz (105.6 kg)  07/14/22 234 lb (106.1 kg)   Current Outpatient Medications  Medication Sig Dispense Refill   ALPRAZolam (XANAX) 0.5 MG tablet Take 1 tablet (0.5 mg total) by mouth daily as needed for anxiety. To be used sparingly for severe anxiety no more than 2-3 times weekly. 15 tablet 0   clotrimazole-betamethasone (LOTRISONE) cream Apply 1 application. topically daily. 30 g 0   esomeprazole (NEXIUM) 40 MG capsule Take 1 capsule (40 mg total) by mouth 2 (two) times daily before a meal. 120 capsule 0   ferrous sulfate 325 (65 FE) MG EC tablet Take 1 tablet (325 mg total) by mouth daily with breakfast. 30 tablet 6   glipiZIDE (GLUCOTROL) 10 MG tablet Take 1 tablet (10 mg total) by mouth 2 (two) times daily before a meal. 180 tablet 1   [name] 1-20 MG-MCG(24) tablet Take 1 tablet by mouth daily.     hydrocortisone (ANUSOL-HC) 2.5 % rectal cream Place 1 Application rectally 2 (two) times daily. 30 g 1   meloxicam (MOBIC) 15 MG tablet Take 1 tablet (15 mg total) by mouth daily as needed for pain. 90 tablet 1   metFORMIN (GLUCOPHAGE-XR) 500 MG 24 hr tablet TAKE ONE TABLET BY MOUTH TWICE A DAY BEFORE A MEAL 180 tablet 0   ondansetron (ZOFRAN-ODT) 8 MG disintegrating tablet Take 1 tablet (8 mg total) by mouth every 8 (eight) hours as needed for nausea. 20 tablet 3   rosuvastatin (CRESTOR) 20 MG tablet TAKE 1  TABLET(20 MG) BY MOUTH DAILY 90 tablet 1   valsartan-hydrochlorothiazide (DIOVAN-HCT) 320-25 MG tablet Take 1 tablet by mouth daily. 90 tablet 1   valACYclovir (VALTREX) 1000 MG tablet Take 1 tablet (1,000 mg total) by mouth 3 (three) times daily. (Patient not taking: Reported on 08/09/2022) 21 tablet 0   No current facility-administered medications for this visit.   Physical Exam: BP 120/70 (BP Location: Left Arm, Patient Position: Sitting, Cuff Size: Large)   Pulse 93   Ht _0  (1.651 m)   Wt 237 lb 4 oz (107.6 kg)   SpO2 99%   BMI 39.48 kg/m  Constitutional: Pleasant,well-developed, female in no acute distress. HEENT: Normocephalic and atraumatic. Conjunctivae are normal. No scleral icterus. Cardiovascular: Normal rate, regular rhythm. Systolic murmur Pulmonary/chest: Effort normal and breath sounds normal. No wheezing, rales or rhonchi. Abdominal: Soft, nondistended, nontender. Bowel sounds active throughout. There are no masses palpable. No hepatomegaly. Extremities: No edema Neurological: Alert and oriented to person place and time. Skin: Skin is warm and dry. No rashes noted. Psychiatric: Normal mood and affect. Behavior is normal.  Labs 11/2021: CBC with low Hb of 10.4, MCV 77.4. CMP with elevated alk phos of 130, AST of 47, elevated glucose of 268.  Labs 03/2022: CBC with elevated WBC of 11.7, low Hb of 9.6, and elevated plts of 443.  TSH nml. Lipase nml. Ferritin/IBC with low ferritin of 5 and low iron sat of 3.4%.   Labs 06/2022: CMP with elevated glucose and elevated alk phos of 143.   Gastric emptying study 09/19/14: IMPRESSION: Gastric retention at 120 min is 36%. Normal retention at 120 min is less than 30%.  KUB 07/08/21: IMPRESSION: Negative.  RUQ U/S 03/25/22: IMPRESSION: 1. Hepatomegaly with abnormal appearing parenchyma suggesting hepatic steatosis and/or other hepatocellular disease. 2. Indeterminate hypoechoic area in the inferior right hepatic lobe which  could represent a mass or focal fatty sparing. Consider follow-up MRI abdomen with contrast. 3. 6 mm likely polyp in the gallbladder, no follow-up required.  MR Abdomen w/contrast 04/05/22: IMPRESSION: Multiple small hypervascular hepatic masses, largest measuring 2.6 cm. These are suspicious for small benign hepatic adenomas, with focal nodular hyperplasia also in the differential diagnosis. Recommend follow-up by abdomen MRI in 6 months, without and with Eovist contrast. Mild hepatomegaly and diffuse steatosis. Small hiatal hernia.  EGD 06/15/22: - Normal esophagus. - Large hiatal hernia. - Six gastric polyps. Resected and retrieved. - Erythematous mucosa in the antrum. Biopsied. - Normal examined duodenum. Path: 1. Surgical [P], 2nd portion of duodenum - SMALL INTESTINAL MUCOSA WITHIN NORMAL LIMITS. 2. Surgical [P], gastric antrum and gastric body - ANTRAL MUCOSA WITH MILD CHRONIC INACTIVE GASTRITIS. - OXYNTIC MUCOSA WITH NO SIGNIFICANT PATHOLOGY. - NO HELICOBACTER PYLORI ORGANISMS IDENTIFIED ON H&E STAINED SLIDE. 3. Surgical [P], gastric body polyps - FUNDIC GLAND POLYPS 4. Surgical [P], random esophageal sites - SQUAMOUS MUCOSA WITH MILD BASAL CELL HYPERPLASIA, OTHERWISE NO SIGNIFICANT PATHOLOGY  Colonoscopy 06/15/22: - The examined portion of the ileum was normal. - One 7 mm polyp in the transverse colon, removed with a cold snare. Resected and retrieved. - Diverticulosis in the sigmoid colon. - Non-bleeding internal hemorrhoids. - Biopsies were taken with a cold forceps for histology in the entire colon. Path: 5. Surgical [P], colon nos, random sites - COLONIC MUCOSA WITHIN NORMAL LIMITS. 6. Surgical [P], colon, transverse, polyp (1) - SESSILE SERRATED POLYP WITHOUT DYSPLASIA  ASSESSMENT AND PLAN: Gastroparesis GERD N&V Iron deficiency anemia Alternating constipation and diarrhea Elevated LFTs Patient presents with N&V that is likely due to a combination of  fluctuations in blood sugars, gastroparesis, and/or constipation. Patient did not respond to a trial of high dose PPI therapy so it is unlikely that uncontrolled GERD is contributing to her N&V. Thus will decrease her Nexium back down to her baseline dosage. Her last EGD showed a large hiatal hernia but was otherwise relatively unremarkable. She is trying to follow a gastroparesis diet to the best of her ability. Will start her on Reglan to see if this helps further with her N&V. Patient was found on recent U/S and MRI to have liver lesions and hepatomegaly. Will plan for a basic liver work up since she does have a history of elevated LFTs and will plan for interval 6 month MRI to follow up her liver lesions. - Gastroparesis diet - Check INR, hepatitis A antibody, hepatitis B surface antibody, hepatitis B surface antigen, ferritin/IBC, ANA, ASMA, IgG, AMA - Decreased Nexium from 40 mg BID to 20 mg QD - Start Reglan 5 mg TID PRN - MRI liver (open MRI) in 09/2022 for liver lesions - Repeat colonoscopy for polyp surveillance due in 06/2027 - Consider further work up of IDA with VCE in the future - RTC in 2 months  [name], MD  I spent 35 minutes of time, including in depth chart review, independent  review of results as outlined above, communicating results with the patient directly, face-to-face time with the patient, coordinating care, ordering studies and medications as appropriate, and documentation.

## 2022-08-14 LAB — HEPATITIS B SURFACE ANTIBODY,QUALITATIVE: Hep B S Ab: NONREACTIVE

## 2022-08-14 LAB — HEPATITIS B SURFACE ANTIGEN: Hepatitis B Surface Ag: NONREACTIVE

## 2022-08-14 LAB — MITOCHONDRIAL ANTIBODIES: Mitochondrial M2 Ab, IgG: <=20 U (ref <=20.0)

## 2022-08-14 LAB — HEPATITIS A ANTIBODY, TOTAL: Hepatitis A AB,Total: NONREACTIVE

## 2022-08-14 LAB — IGG: IgG (Immunoglobin G), Serum: 734 mg/dL (ref 600–1640)

## 2022-08-14 LAB — ANA: Anti Nuclear Antibody (ANA): NEGATIVE

## 2022-08-14 LAB — ANTI-SMOOTH MUSCLE ANTIBODY, IGG: Actin (Smooth Muscle) Antibody (IGG): <20 U (ref <20)

## 2022-08-15 ENCOUNTER — Other Ambulatory Visit: Payer: Self-pay | Admitting: Internal Medicine

## 2022-08-21 NOTE — Progress Notes (Unsigned)
HPI: Lindsey Maxwell is a 46 y.o. female who  has a past medical history of Anemia, Anxiety, Bell's palsy, Diabetes mellitus without complication ([place]), GERD (gastroesophageal reflux disease), Hypercalcemia, Hypertension, Obesity, and PCOS (polycystic ovarian syndrome).  she presents to [facility] today, 08/22/22,  for chief complaint of: Annual physical exam  Dentist: looking for a new dentist, not sure when last visit was Eye exam: within the last year, no correction Exercise: walking for exercise some but not as much as she should Diet: some room for improvement, some trouble drinking milk due to GI symptoms Pap smear: November 2022 Mammogram: overdue, 2020- normal COVID vaccine: none  Concerns: None  Past medical, surgical, social and family history reviewed:  Patient Active Problem List   Diagnosis Date Noted   Uncontrolled type 2 diabetes mellitus with hyperglycemia, without long-term current use of insulin ([place]) 03/02/2022   Gastroparesis 03/02/2022   Dysuria 03/02/2022   History of anemia 03/18/2020   Morbid obesity ([place]) 09/14/2018   Vitamin B 12 deficiency 01/22/2018   Vitamin D deficiency 01/22/2018   Tobacco use disorder 12/28/2016   Microalbuminuria 38/18/2993   Neutrophilic leukocytosis 71/69/6789   Thrombocytosis 11/22/2016   Hyperlipidemia associated with type 2 diabetes mellitus ([place]) 03/19/2015   OSA (obstructive sleep apnea) 08/15/2014   Anxiety state 12/30/2013   Essential hypertension 12/30/2013   Gastroesophageal reflux disease without esophagitis 11/19/2013   Hirsutism 11/19/2013   Insomnia 11/19/2013   Polycystic ovarian syndrome 11/19/2013    Past Surgical History:  Procedure Laterality Date   UPPER GASTROINTESTINAL ENDOSCOPY      Social History   Tobacco Use   Smoking status: Former    Types: Cigarettes    Quit date: 06/22/2011    Years since quitting: 11.1   Smokeless tobacco: Never  Substance Use Topics    Alcohol use: Yes    Comment: occasionally    Family History  Problem Relation Age of Onset   Cancer Mother        breast CA   Diabetes Mother    Colon polyps Mother    Irritable bowel syndrome Mother    Hypertension Father    Diabetes Father    Heart attack Brother 24   Diabetes Maternal Grandmother    Diabetes Maternal Grandfather    Diabetes Paternal Grandmother    Diabetes Paternal Grandfather    Colon cancer Neg Hx    Esophageal cancer Neg Hx    Rectal cancer Neg Hx    Stomach cancer Neg Hx      Current medication list and allergy/intolerance information reviewed:    Current Outpatient Medications  Medication Sig Dispense Refill   ALPRAZolam (XANAX) 0.5 MG tablet Take 1 tablet (0.5 mg total) by mouth daily as needed for anxiety. To be used sparingly for severe anxiety no more than 2-3 times weekly. 15 tablet 0   clotrimazole-betamethasone (LOTRISONE) cream Apply 1 application. topically daily. 30 g 0   esomeprazole (NEXIUM) 20 MG capsule Take 1 capsule (20 mg total) by mouth daily. 30 capsule 2   ferrous sulfate 325 (65 FE) MG EC tablet Take 1 tablet (325 mg total) by mouth daily with breakfast. 30 tablet 6   glipiZIDE (GLUCOTROL) 10 MG tablet Take 1 tablet (10 mg total) by mouth 2 (two) times daily before a meal. 180 tablet 1   [name] 1-20 MG-MCG(24) tablet Take 1 tablet by mouth daily.     hydrocortisone (ANUSOL-HC) 2.5 % rectal cream Place 1 Application  rectally 2 (two) times daily. 30 g 1   meloxicam (MOBIC) 15 MG tablet Take 1 tablet (15 mg total) by mouth daily as needed for pain. 90 tablet 1   metFORMIN (GLUCOPHAGE-XR) 500 MG 24 hr tablet TAKE ONE TABLET BY MOUTH TWICE A DAY BEFORE A MEAL 180 tablet 0   metoCLOPramide (REGLAN) 5 MG tablet Take 1 tablet (5 mg total) by mouth 3 (three) times daily as needed for nausea. 90 tablet 2   ondansetron (ZOFRAN-ODT) 8 MG disintegrating tablet Take 1 tablet (8 mg total) by mouth every 8 (eight) hours as needed for  nausea. 20 tablet 3   rosuvastatin (CRESTOR) 20 MG tablet TAKE 1 TABLET(20 MG) BY MOUTH DAILY 90 tablet 1   valACYclovir (VALTREX) 1000 MG tablet Take 1 tablet (1,000 mg total) by mouth 3 (three) times daily. 21 tablet 0   valsartan-hydrochlorothiazide (DIOVAN-HCT) 320-25 MG tablet Take 1 tablet by mouth daily. 90 tablet 1   No current facility-administered medications for this visit.    Allergies  Allergen Reactions   Sitagliptin Swelling   Amlodipine Nausea And Vomiting    Headaches    Dapagliflozin    Losartan     Dizziness, burning sensation   Metformin Nausea And Vomiting   Metoprolol Nausea And Vomiting    headache   Venlafaxine     Swets, chills, shaky   Atorvastatin Diarrhea   Clonidine Cough    Dry throat    Review of Systems: Constitutional:  No  fever, no chills, No recent illness, No unintentional weight changes. No significant fatigue.  HEENT: No  headache, no vision change, no hearing change, No sore throat, No  sinus pressure Cardiac: No  chest pain, No  pressure, No palpitations, No  Orthopnea Respiratory:  No  shortness of breath. No  Cough Gastrointestinal: No  abdominal pain, No  nausea, No  vomiting,  No  blood in stool, No  diarrhea, No  constipation  Musculoskeletal: No new myalgia/arthralgia Skin: No  Rash, No other wounds/concerning lesions Genitourinary: No  incontinence, No  abnormal genital bleeding, No abnormal genital discharge Hem/Onc: No  easy bruising/bleeding, No  abnormal lymph node Endocrine: No cold intolerance,  No heat intolerance. No polyuria/polydipsia/polyphagia  Neurologic: No  weakness, No  dizziness, No  slurred speech/focal weakness/facial droop Psychiatric: No  concerns with depression, No  concerns with anxiety, No sleep problems, No mood problems  Exam:  BP 119/74   Pulse 80   Ht '5\' 5"'$  (1.651 m)   Wt 238 lb 9.6 oz (108.2 kg)   SpO2 99%   BMI 39.71 kg/m  Constitutional: VS see above. General Appearance: alert,  well-developed, well-nourished, NAD Eyes: Normal lids and conjunctive, non-icteric sclera Ears, Nose, Mouth, Throat: MMM, Normal external inspection ears/nares/mouth/lips/gums. TM normal bilaterally. Pharynx/tonsils no erythema, no exudate. Nasal mucosa normal.  Neck: No masses, trachea midline. No thyroid enlargement. No tenderness/mass appreciated. No lymphadenopathy Respiratory: Normal respiratory effort. no wheeze, no rhonchi, no rales Cardiovascular: S1/S2 normal, no murmur, no rub/gallop auscultated. RRR. No lower extremity edema. Pedal pulse II/IV bilaterally DP and PT. No carotid bruit or JVD. No abdominal aortic bruit. Gastrointestinal: Nontender, no masses. No hepatomegaly, no splenomegaly. No hernia appreciated. Bowel sounds normal. Rectal exam deferred.  Musculoskeletal: Gait normal. No clubbing/cyanosis of digits.  Neurological: Normal balance/coordination. No tremor. No cranial nerve deficit on limited exam. Motor and sensation intact and symmetric. Cerebellar reflexes intact.  Skin: warm, dry, intact. No rash/ulcer. No concerning nevi or subq nodules on limited exam.  Psychiatric: Normal judgment/insight. Normal mood and affect. Oriented x3.    ASSESSMENT/PLAN:   1. Annual physical exam Up-to-date on preventative care although would recommend she update her dental exam.  Deferring labs today as she recently had a full panel checked with no significant concerns.  Wellness information provided with AVS.  2. Encounter for screening mammogram for malignant neoplasm of breast Mammogram ordered. - MM DIGITAL SCREENING BILATERAL; Future  Orders Placed This Encounter  Procedures   MM DIGITAL SCREENING BILATERAL    No orders of the defined types were placed in this encounter.  There are no Patient Instructions on file for this visit.  Follow-up plan: Return in about 8 weeks (around 10/17/2022) for DM follow up.  Clearnce Sorrel, DNP, APRN, FNP-BC [facility]

## 2022-08-22 ENCOUNTER — Ambulatory Visit (INDEPENDENT_AMBULATORY_CARE_PROVIDER_SITE_OTHER): Payer: BC Managed Care – PPO | Admitting: Medical-Surgical

## 2022-08-22 ENCOUNTER — Encounter: Payer: Self-pay | Admitting: Medical-Surgical

## 2022-08-22 VITALS — BP 119/74 | HR 80 | Ht 65.0 in | Wt 238.6 lb

## 2022-08-22 DIAGNOSIS — Z1231 Encounter for screening mammogram for malignant neoplasm of breast: Secondary | ICD-10-CM

## 2022-08-22 DIAGNOSIS — Z0000 Encounter for general adult medical examination without abnormal findings: Secondary | ICD-10-CM | POA: Diagnosis not present

## 2022-09-05 ENCOUNTER — Encounter: Payer: Self-pay | Admitting: Internal Medicine

## 2022-09-06 ENCOUNTER — Other Ambulatory Visit: Payer: Self-pay

## 2022-09-07 ENCOUNTER — Other Ambulatory Visit (HOSPITAL_COMMUNITY): Payer: Self-pay

## 2022-09-07 ENCOUNTER — Telehealth: Payer: Self-pay

## 2022-09-07 ENCOUNTER — Telehealth: Payer: Self-pay | Admitting: Pharmacy Technician

## 2022-09-07 ENCOUNTER — Other Ambulatory Visit: Payer: Self-pay

## 2022-09-07 MED ORDER — MOTEGRITY 2 MG PO TABS: 2.0000 mg | ORAL_TABLET | Freq: Every day | ORAL | 1 refills | Status: DC

## 2022-09-07 NOTE — Telephone Encounter (Signed)
This PA has been submitted and status is pending.

## 2022-09-07 NOTE — Telephone Encounter (Signed)
Patient has contacted [place] to say her insurance is asking for PA on Motegrity.  She does have constipation alternating with diarrhea.  Thank you for looking into this.

## 2022-09-07 NOTE — Telephone Encounter (Signed)
Patient Advocate Encounter  Received notification from [employer] that prior authorization for MOTEGRITY '2MG'$  is required.   PA submitted on 11.29.23 Key [id number] Status is pending

## 2022-09-08 NOTE — Telephone Encounter (Signed)
Received a fax regarding Prior Authorization from Lindsey Maxwell for Lindsey '2MG'$ . Authorization has been DENIED because PT HAS NOT HAD A TRIAL AND INADEQUATE RESPONSE OR INTOLERANCE TO ONE PREFERRED DRUG FOR CONSTIPATION AMITIZA, LINZESS, AND MOVANTIK.

## 2022-09-13 ENCOUNTER — Ambulatory Visit
Admission: RE | Admit: 2022-09-13 | Discharge: 2022-09-13 | Disposition: A | Discharge status: Home / Self Care | Payer: BC Managed Care – PPO | Source: Ambulatory Visit | Attending: Internal Medicine | Admitting: Internal Medicine

## 2022-09-13 DIAGNOSIS — K769 Liver disease, unspecified: Secondary | ICD-10-CM

## 2022-09-13 MED ORDER — GADOPICLENOL 0.5 MMOL/ML IV SOLN
10.0000 mL | Freq: Once | INTRAVENOUS | Status: AC | PRN
  Administered 2022-09-13: 10 mL via INTRAVENOUS

## 2022-09-14 ENCOUNTER — Ambulatory Visit (INDEPENDENT_AMBULATORY_CARE_PROVIDER_SITE_OTHER): Payer: BC Managed Care – PPO

## 2022-09-14 ENCOUNTER — Other Ambulatory Visit: Payer: Self-pay

## 2022-09-14 DIAGNOSIS — Z1231 Encounter for screening mammogram for malignant neoplasm of breast: Secondary | ICD-10-CM | POA: Diagnosis not present

## 2022-09-14 DIAGNOSIS — K5909 Other constipation: Secondary | ICD-10-CM | POA: Insufficient documentation

## 2022-09-14 DIAGNOSIS — Z0000 Encounter for general adult medical examination without abnormal findings: Secondary | ICD-10-CM

## 2022-09-14 MED ORDER — LINACLOTIDE 145 MCG PO CAPS: 145.0000 ug | ORAL_CAPSULE | Freq: Every day | ORAL | 1 refills | Status: DC

## 2022-09-14 NOTE — Telephone Encounter (Signed)
Insurance denied. Changed to Linzess 145 mcg. Patient notified.

## 2022-09-15 ENCOUNTER — Other Ambulatory Visit: Payer: Self-pay

## 2022-09-15 ENCOUNTER — Ambulatory Visit: Payer: BC Managed Care – PPO

## 2022-09-15 DIAGNOSIS — K769 Liver disease, unspecified: Secondary | ICD-10-CM

## 2022-09-20 ENCOUNTER — Ambulatory Visit
Admission: EM | Admit: 2022-09-20 | Discharge: 2022-09-20 | Disposition: A | Discharge status: Home / Self Care | Payer: BC Managed Care – PPO | Attending: Family Medicine | Admitting: Family Medicine

## 2022-09-20 DIAGNOSIS — Z1152 Encounter for screening for COVID-19: Secondary | ICD-10-CM | POA: Insufficient documentation

## 2022-09-20 DIAGNOSIS — I10 Essential (primary) hypertension: Secondary | ICD-10-CM | POA: Diagnosis not present

## 2022-09-20 DIAGNOSIS — E1165 Type 2 diabetes mellitus with hyperglycemia: Secondary | ICD-10-CM | POA: Insufficient documentation

## 2022-09-20 DIAGNOSIS — K3184 Gastroparesis: Secondary | ICD-10-CM | POA: Insufficient documentation

## 2022-09-20 DIAGNOSIS — R112 Nausea with vomiting, unspecified: Secondary | ICD-10-CM | POA: Diagnosis not present

## 2022-09-20 DIAGNOSIS — R509 Fever, unspecified: Secondary | ICD-10-CM | POA: Diagnosis not present

## 2022-09-20 DIAGNOSIS — Z7984 Long term (current) use of oral hypoglycemic drugs: Secondary | ICD-10-CM | POA: Insufficient documentation

## 2022-09-20 MED ORDER — ONDANSETRON 8 MG PO TBDP: 8.0000 mg | ORAL_TABLET | Freq: Three times a day (TID) | ORAL | 0 refills | Status: DC | PRN

## 2022-09-20 NOTE — Discharge Instructions (Addendum)
Advised patient may take OTC Tylenol 1000 mg every 6 hours for fever and myalgias.  Advised may take Zofran daily or as needed for nausea.  Advised we will follow-up with COVID-19/influenza results once received.  Encouraged patient to increase daily water intake to 64 ounces per day while taking these medications.  Advised if symptoms worsen and/or unresolved please follow-up with PCP or here for further evaluation.

## 2022-09-20 NOTE — ED Triage Notes (Signed)
Pt c/o vomiting and abd pain that started today. Hx of GERD but says this feels different. Some diarrhea today. (10 episodes) Taking zofran prn.

## 2022-09-20 NOTE — ED Provider Notes (Signed)
[facility]    CSN: [id number] Arrival date & time: 09/20/22  1610      History   Chief Complaint Chief Complaint  Patient presents with   Nausea   Emesis   Abdominal Pain    HPI Lindsey Maxwell is a 46 y.o. female.   HPI 46 year old female presents with fever, nausea, vomiting, abdominal pain started today.  PMH significant for morbid obesity, Bell's palsy, HTN, and T2 DM without complication.  Patient's oral temperature at triage is 100.2.  Past Medical History:  Diagnosis Date   Anemia    Anxiety    Bell's palsy    Diabetes mellitus without complication (HCC)    GERD (gastroesophageal reflux disease)    Hypercalcemia    Hypertension    Obesity    PCOS (polycystic ovarian syndrome)     Patient Active Problem List   Diagnosis Date Noted   Constipation, chronic 09/14/2022   Uncontrolled type 2 diabetes mellitus with hyperglycemia, without long-term current use of insulin ([place]) 03/02/2022   Gastroparesis 03/02/2022   Dysuria 03/02/2022   History of anemia 03/18/2020   Morbid obesity ([place]) 09/14/2018   Vitamin B 12 deficiency 01/22/2018   Vitamin D deficiency 01/22/2018   Tobacco use disorder 12/28/2016   Microalbuminuria 23/76/2831   Neutrophilic leukocytosis 51/76/1607   Thrombocytosis 11/22/2016   Hyperlipidemia associated with type 2 diabetes mellitus ([place]) 03/19/2015   OSA (obstructive sleep apnea) 08/15/2014   Anxiety state 12/30/2013   Essential hypertension 12/30/2013   Gastroesophageal reflux disease without esophagitis 11/19/2013   Hirsutism 11/19/2013   Insomnia 11/19/2013   Polycystic ovarian syndrome 11/19/2013    Past Surgical History:  Procedure Laterality Date   UPPER GASTROINTESTINAL ENDOSCOPY      OB History   No obstetric history on file.      Home Medications    Prior to Admission medications   Medication Sig Start Date End Date Taking? Authorizing Provider  linaclotide Rolan Lipa) 145 MCG CAPS capsule Take 1 capsule  (145 mcg total) by mouth daily before breakfast. 09/14/22   Sharyn Creamer, MD  ondansetron (ZOFRAN-ODT) 8 MG disintegrating tablet Take 1 tablet (8 mg total) by mouth every 8 (eight) hours as needed for nausea or vomiting. 09/20/22  Yes Eliezer Lofts, FNP  ALPRAZolam Duanne Moron) 0.5 MG tablet Take 1 tablet (0.5 mg total) by mouth daily as needed for anxiety. To be used sparingly for severe anxiety no more than 2-3 times weekly. 06/29/22   [name], NP  clotrimazole-betamethasone (LOTRISONE) cream Apply 1 application. topically daily. 03/02/22   [name], NP  esomeprazole (NEXIUM) 20 MG capsule Take 1 capsule (20 mg total) by mouth daily. 08/09/22 10/08/22  Sharyn Creamer, MD  ferrous sulfate 325 (65 FE) MG EC tablet Take 1 tablet (325 mg total) by mouth daily with breakfast. 03/15/22   Sharyn Creamer, MD  glipiZIDE (GLUCOTROL) 10 MG tablet Take 1 tablet (10 mg total) by mouth 2 (two) times daily before a meal. 06/29/22   Jessup, Joy, NP  [name] 1-20 MG-MCG(24) tablet Take 1 tablet by mouth daily. 12/23/21   [provider]  hydrocortisone (ANUSOL-HC) 2.5 % rectal cream Place 1 Application rectally 2 (two) times daily. 06/15/22   Sharyn Creamer, MD  meloxicam (MOBIC) 15 MG tablet Take 1 tablet (15 mg total) by mouth daily as needed for pain. 12/21/21   [name], NP  metFORMIN (GLUCOPHAGE-XR) 500 MG 24 hr tablet TAKE ONE TABLET BY MOUTH TWICE A DAY  BEFORE A MEAL 05/09/22   [name], NP  metoCLOPramide (REGLAN) 5 MG tablet Take 1 tablet (5 mg total) by mouth 3 (three) times daily as needed for nausea. 08/09/22   Sharyn Creamer, MD  rosuvastatin (CRESTOR) 20 MG tablet TAKE 1 TABLET(20 MG) BY MOUTH DAILY 02/02/22   [name], NP  valACYclovir (VALTREX) 1000 MG tablet Take 1 tablet (1,000 mg total) by mouth 3 (three) times daily. 07/13/22   Raylene Everts, MD  valsartan-hydrochlorothiazide (DIOVAN-HCT) 320-25 MG tablet Take 1 tablet by mouth daily. 06/29/22   [name], NP    Family  History Family History  Problem Relation Age of Onset   Cancer Mother        breast CA   Diabetes Mother    Colon polyps Mother    Irritable bowel syndrome Mother    Hypertension Father    Diabetes Father    Heart attack Brother 86   Diabetes Maternal Grandmother    Diabetes Maternal Grandfather    Diabetes Paternal Grandmother    Diabetes Paternal Grandfather    Colon cancer Neg Hx    Esophageal cancer Neg Hx    Rectal cancer Neg Hx    Stomach cancer Neg Hx     Social History Social History   Tobacco Use   Smoking status: Former    Types: Cigarettes    Quit date: 06/22/2011    Years since quitting: 11.2   Smokeless tobacco: Never  Vaping Use   Vaping Use: Former  Substance Use Topics   Alcohol use: Yes    Comment: occasionally   Drug use: Never     Allergies   Sitagliptin, Amlodipine, Dapagliflozin, Losartan, Metformin, Metoprolol, Venlafaxine, Atorvastatin, and Clonidine   Review of Systems Review of Systems  Constitutional:  Positive for fever.  Gastrointestinal:  Positive for abdominal pain, nausea and vomiting.  All other systems reviewed and are negative.    Physical Exam Triage Vital Signs ED Triage Vitals  Enc Vitals Group     BP 09/20/22 1639 (!) 155/90     Pulse Rate 09/20/22 1639 (!) 118     Resp 09/20/22 1639 18     Temp 09/20/22 1639 100.2 F (37.9 C)     Temp Source 09/20/22 1639 Oral     SpO2 09/20/22 1639 98 %     Weight --      Height --      Head Circumference --      Peak Flow --      Pain Score 09/20/22 1640 0     Pain Loc --      Pain Edu? --      Excl. in [place]? --    No data found.  Updated Vital Signs BP (!) 155/90 (BP Location: Right Arm)   Pulse (!) 118   Temp 100.2 F (37.9 C) (Oral)   Resp 18   SpO2 98%       Physical Exam Vitals and nursing note reviewed.  Constitutional:      Appearance: Normal appearance. She is obese. She is ill-appearing.  HENT:     Head: Normocephalic and atraumatic.     Right Ear:  Tympanic membrane, ear canal and external ear normal.     Left Ear: Tympanic membrane, ear canal and external ear normal.     Mouth/Throat:     Mouth: Mucous membranes are moist.     Pharynx: Oropharynx is clear.  Eyes:     Extraocular Movements: Extraocular movements  intact.     Conjunctiva/sclera: Conjunctivae normal.     Pupils: Pupils are equal, round, and reactive to light.  Cardiovascular:     Rate and Rhythm: Normal rate and regular rhythm.     Pulses: Normal pulses.     Heart sounds: Normal heart sounds.  Pulmonary:     Effort: Pulmonary effort is normal.     Breath sounds: Normal breath sounds. No wheezing, rhonchi or rales.  Musculoskeletal:        General: Normal range of motion.     Cervical back: Normal range of motion and neck supple.  Skin:    General: Skin is warm and dry.  Neurological:     General: No focal deficit present.     Mental Status: She is alert and oriented to person, place, and time. Mental status is at baseline.      UC Treatments / Results  Labs (all labs ordered are listed, but only abnormal results are displayed) Labs Reviewed  RESP PANEL BY RT-PCR (FLU A&B, COVID) [id number]    EKG   Radiology No results found.  Procedures Procedures (including critical care time)  Medications Ordered in UC Medications - No data to display  Initial Impression / Assessment and Plan / UC Course  I have reviewed the triage vital signs and the nursing notes.  Pertinent labs & imaging results that were available during my care of the patient were reviewed by me and considered in my medical decision making (see chart for details).     MDM: 1. Fever-lab 10093 ordered.  Advised OTC Tylenol; 2.  Nausea and vomiting, unspecified type.  Rx'd Zofran. Advised patient may take OTC Tylenol 1000 mg every 6 hours for fever and myalgias.  Advised may take Zofran daily or as needed for nausea.  Advised we will follow-up with COVID-19/Influenza results once received.   Encouraged patient to increase daily water intake to 64 ounces per day while taking these medications.  Advised if symptoms worsen and/or unresolved please follow-up with PCP or here for further evaluation.  Work note provided per patient request prior to discharge.  Patient discharged home, hemodynamically stable. Final Clinical Impressions(s) / UC Diagnoses   Final diagnoses:  Nausea and vomiting, unspecified vomiting type  Fever, unspecified     Discharge Instructions      Advised patient may take OTC Tylenol 1000 mg every 6 hours for fever and myalgias.  Advised may take Zofran daily or as needed for nausea.  Advised we will follow-up with COVID-19/influenza results once received.  Encouraged patient to increase daily water intake to 64 ounces per day while taking these medications.  Advised if symptoms worsen and/or unresolved please follow-up with PCP or here for further evaluation.     ED Prescriptions     Medication Sig Dispense Auth. Provider   ondansetron (ZOFRAN-ODT) 8 MG disintegrating tablet Take 1 tablet (8 mg total) by mouth every 8 (eight) hours as needed for nausea or vomiting. 24 tablet Eliezer Lofts, FNP      PDMP not reviewed this encounter.   Eliezer Lofts, [place] 09/20/22 1814

## 2022-09-21 ENCOUNTER — Telehealth: Payer: Self-pay | Admitting: Emergency Medicine

## 2022-09-21 LAB — RESP PANEL BY RT-PCR (FLU A&B, COVID) ARPGX2
Influenza A by PCR: NEGATIVE
Influenza B by PCR: NEGATIVE
SARS Coronavirus 2 by RT PCR: NEGATIVE

## 2022-09-21 NOTE — Telephone Encounter (Signed)
Patient called regarding her test results.  Advised that her results were negative.  Patient states that per Eliezer Lofts would send in an antibiotic for sinus infection.  Patient is still running fevers today from 101 to 100.2-100.7.  Please advise.

## 2022-09-21 NOTE — Telephone Encounter (Signed)
Patient informed of Dr Francesca Oman recommendation.

## 2022-09-23 ENCOUNTER — Other Ambulatory Visit: Payer: Self-pay | Admitting: Medical-Surgical

## 2022-09-23 ENCOUNTER — Telehealth: Payer: Self-pay | Admitting: Emergency Medicine

## 2022-09-23 DIAGNOSIS — F411 Generalized anxiety disorder: Secondary | ICD-10-CM

## 2022-09-23 NOTE — Telephone Encounter (Signed)
Patient called requesting an antibiotic for a possible sinus infection since her testing was negative.  Please advise.

## 2022-10-12 ENCOUNTER — Other Ambulatory Visit (INDEPENDENT_AMBULATORY_CARE_PROVIDER_SITE_OTHER): Payer: BC Managed Care – PPO

## 2022-10-12 ENCOUNTER — Other Ambulatory Visit: Payer: Self-pay

## 2022-10-12 ENCOUNTER — Ambulatory Visit (INDEPENDENT_AMBULATORY_CARE_PROVIDER_SITE_OTHER): Payer: BC Managed Care – PPO | Admitting: Internal Medicine

## 2022-10-12 ENCOUNTER — Encounter: Payer: Self-pay | Admitting: Internal Medicine

## 2022-10-12 VITALS — BP 146/90 | HR 86 | Ht 65.0 in | Wt 237.1 lb

## 2022-10-12 DIAGNOSIS — K769 Liver disease, unspecified: Secondary | ICD-10-CM

## 2022-10-12 DIAGNOSIS — D649 Anemia, unspecified: Secondary | ICD-10-CM

## 2022-10-12 DIAGNOSIS — K3184 Gastroparesis: Secondary | ICD-10-CM

## 2022-10-12 DIAGNOSIS — R7989 Other specified abnormal findings of blood chemistry: Secondary | ICD-10-CM

## 2022-10-12 LAB — CBC WITH DIFFERENTIAL/PLATELET
Basophils Absolute: 0.2 10*3/uL — ABNORMAL HIGH (ref 0.0–0.1)
Basophils Relative: 1.5 % (ref 0.0–3.0)
Eosinophils Absolute: 0.2 10*3/uL (ref 0.0–0.7)
Eosinophils Relative: 1.3 % (ref 0.0–5.0)
HCT: 32.6 % — ABNORMAL LOW (ref 36.0–46.0)
Hemoglobin: 10.5 g/dL — ABNORMAL LOW (ref 12.0–15.0)
Lymphocytes Relative: 17.7 % (ref 12.0–46.0)
Lymphs Abs: 2.6 10*3/uL (ref 0.7–4.0)
MCHC: 32.3 g/dL (ref 30.0–36.0)
MCV: 78.1 fl (ref 78.0–100.0)
Monocytes Absolute: 0.7 10*3/uL (ref 0.1–1.0)
Monocytes Relative: 5 % (ref 3.0–12.0)
Neutro Abs: 11.1 10*3/uL — ABNORMAL HIGH (ref 1.4–7.7)
Neutrophils Relative %: 74.5 % (ref 43.0–77.0)
Platelets: 527 10*3/uL — ABNORMAL HIGH (ref 150.0–400.0)
RBC: 4.18 Mil/uL (ref 3.87–5.11)
RDW: 16.6 % — ABNORMAL HIGH (ref 11.5–15.5)
WBC: 14.9 10*3/uL — ABNORMAL HIGH (ref 4.0–10.5)

## 2022-10-12 LAB — COMPREHENSIVE METABOLIC PANEL
ALT: 15 U/L (ref 0–35)
AST: 16 U/L (ref 0–37)
Albumin: 4.3 g/dL (ref 3.5–5.2)
Alkaline Phosphatase: 122 U/L — ABNORMAL HIGH (ref 39–117)
BUN: 12 mg/dL (ref 6–23)
CO2: 27 mEq/L (ref 19–32)
Calcium: 9.7 mg/dL (ref 8.4–10.5)
Chloride: 95 mEq/L — ABNORMAL LOW (ref 96–112)
Creatinine, Ser: 0.74 mg/dL (ref 0.40–1.20)
GFR: 98.22 mL/min (ref >60.00)
Glucose, Bld: 258 mg/dL — ABNORMAL HIGH (ref 70–99)
Potassium: 4.5 mEq/L (ref 3.5–5.1)
Sodium: 135 mEq/L (ref 135–145)
Total Bilirubin: 0.3 mg/dL (ref 0.2–1.2)
Total Protein: 7.6 g/dL (ref 6.0–8.3)

## 2022-10-12 LAB — IBC + FERRITIN
Ferritin: 8.6 ng/mL — ABNORMAL LOW (ref 10.0–291.0)
Iron: 55 ug/dL (ref 42–145)
Saturation Ratios: 9.8 % — ABNORMAL LOW (ref 20.0–50.0)
TIBC: 560 ug/dL — ABNORMAL HIGH (ref 250.0–450.0)
Transferrin: 400 mg/dL — ABNORMAL HIGH (ref 212.0–360.0)

## 2022-10-12 NOTE — Patient Instructions (Addendum)
You are schedule for a follow up visit on 12/20/2022 at 8:30 am  If you are age 45 or older, your body mass index should be between 23-30. Your Body mass index is 39.46 kg/m. If this is out of the aforementioned range listed, please consider follow up with your Primary Care Provider.  If you are age 48 or younger, your body mass index should be between 19-25. Your Body mass index is 39.46 kg/m. If this is out of the aformentioned range listed, please consider follow up with your Primary Care Provider.   ________________________________________________________  The [facility] providers would like to encourage you to use [facility] to communicate with providers for non-urgent requests or questions.  Due to long hold times on the telephone, sending your provider a message by [facility] may be a faster and more efficient way to get a response.  Please allow 48 business hours for a response.  Please remember that this is for non-urgent requests.   Your provider has requested that you go to the basement level for lab work before leaving today. Press "B" on the elevator. The lab is located at the first door on the left as you exit the elevator.   Due to recent changes in healthcare laws, you may see the results of your imaging and laboratory studies on MyChart before your provider has had a chance to review them.  We understand that in some cases there may be results that are confusing or concerning to you. Not all laboratory results come back in the same time frame and the provider may be waiting for multiple results in order to interpret others.  Please give [place] 48 hours in order for your provider to thoroughly review all the results before contacting the office for clarification of your results.  Thank you for entrusting me with your care and choosing [facility].  Dr [name]

## 2022-10-12 NOTE — Progress Notes (Signed)
Chief Complaint: Gastroparesis  HPI : 47 year old female with history of DM and PCOS presents for follow up of gastroparesis  Interval History: She took Reglan for 1 month, but this seemed to make her N&V worse. Thus I attempted to start her on prucalopride (M[employer]), which was not approved by her insurance. Thus I attempted to start her on Linzess. She got a stomach virus so she did not start her Linzess therapy. She did pick up a 30 day supply and then when she was contacted about a refill from her pharmacy, she was told that the refill would cost over $300 because her deductible had restarted. Her bowel habits are fairly irregular, alternating between constipation and diarrhea. She alternates between having 1 BM per day and 5 BMs per day. She has continued to have issues with N&V. She vomits on average 3-4 times per week. She has been under stress because she lost 2 of her dogs over the last 3 months. Her blood sugars have been a bit uncontrolled due to being on antibiotics for sinus infection. She drinks 1 drink of alcohol every 3 months. She is not having any menstrual periods. She has not been strictly adhering to the gastroparesis diet due to the recent holidays  Wt Readings from Last 3 Encounters:  10/12/22 237 lb 2 oz (107.6 kg)  08/22/22 238 lb 9.6 oz (108.2 kg)  08/09/22 237 lb 4 oz (107.6 kg)    Current Outpatient Medications  Medication Sig Dispense Refill   ALPRAZolam (XANAX) 0.5 MG tablet TAKE 1 TABLET BY MOUTH DAILY AS NEEDED FOR ANXIETY. TO BE USED SPARINGLY FOR SEVERE ANXIETY. NO MORE THAN 2 TO 3 TIMES WEEKLY 15 tablet 0   clotrimazole-betamethasone (LOTRISONE) cream Apply 1 application. topically daily. 30 g 0   ferrous sulfate 325 (65 FE) MG EC tablet Take 1 tablet (325 mg total) by mouth daily with breakfast. 30 tablet 6   glipiZIDE (GLUCOTROL) 10 MG tablet Take 1 tablet (10 mg total) by mouth 2 (two) times daily before a meal. 180 tablet 1   [name] 1-20 MG-MCG(24)  tablet Take 1 tablet by mouth daily.     hydrocortisone (ANUSOL-HC) 2.5 % rectal cream Place 1 Application rectally 2 (two) times daily. 30 g 1   linaclotide (LINZESS) 145 MCG CAPS capsule Take 1 capsule (145 mcg total) by mouth daily before breakfast. (Patient not taking: Reported on 10/12/2022) 30 capsule 1   meloxicam (MOBIC) 15 MG tablet Take 1 tablet (15 mg total) by mouth daily as needed for pain. 90 tablet 1   metFORMIN (GLUCOPHAGE-XR) 500 MG 24 hr tablet TAKE ONE TABLET BY MOUTH TWICE A DAY BEFORE A MEAL 180 tablet 0   metoCLOPramide (REGLAN) 5 MG tablet Take 1 tablet (5 mg total) by mouth 3 (three) times daily as needed for nausea. 90 tablet 2   ondansetron (ZOFRAN-ODT) 8 MG disintegrating tablet Take 1 tablet (8 mg total) by mouth every 8 (eight) hours as needed for nausea or vomiting. 24 tablet 0   rosuvastatin (CRESTOR) 20 MG tablet TAKE 1 TABLET(20 MG) BY MOUTH DAILY 90 tablet 1   valACYclovir (VALTREX) 1000 MG tablet Take 1 tablet (1,000 mg total) by mouth 3 (three) times daily. 21 tablet 0   valsartan-hydrochlorothiazide (DIOVAN-HCT) 320-25 MG tablet Take 1 tablet by mouth daily. 90 tablet 1   esomeprazole (NEXIUM) 20 MG capsule Take 1 capsule (20 mg total) by mouth daily. 30 capsule 2   No current facility-administered medications  for this visit.   Physical Exam: BP (!) 146/90 (BP Location: Left Arm, Patient Position: Sitting, Cuff Size: Large)   Pulse 86   Ht _0  (1.651 m)   Wt 237 lb 2 oz (107.6 kg)   SpO2 96%   BMI 39.46 kg/m  Constitutional: Pleasant,well-developed, female in no acute distress. HEENT: Normocephalic and atraumatic. Conjunctivae are normal. No scleral icterus. Cardiovascular: Normal rate Pulmonary/chest: No increased WOB Abdominal: Soft, nondistended, nontender. Bowel sounds active throughout. There are no masses palpable. No hepatomegaly. Extremities: No edema Neurological: Alert and oriented to person place and time. Skin: Skin is warm and dry. No  rashes noted. Psychiatric: Normal mood and affect. Behavior is normal.  Labs 11/2021: CBC with low Hb of 10.4, MCV 77.4. CMP with elevated alk phos of 130, AST of 47, elevated glucose of 268.  Labs 03/2022: CBC with elevated WBC of 11.7, low Hb of 9.6, and elevated plts of 443. TSH nml. Lipase nml. Ferritin/IBC with low ferritin of 5 and low iron sat of 3.4%.   Labs 06/2022: CMP with elevated glucose and elevated alk phos of 143.   Labs 07/2022: Hep A Ab NR. Hep B surface antigen NR. Hep B surface antibody NR. IgG nml. ASMA negative. ANA negative. AMA negative. INR nml.   Gastric emptying study 09/19/14: IMPRESSION: Gastric retention at 120 min is 36%. Normal retention at 120 min is less than 30%.  KUB 07/08/21: IMPRESSION: Negative.  RUQ U/S 03/25/22: IMPRESSION: 1. Hepatomegaly with abnormal appearing parenchyma suggesting hepatic steatosis and/or other hepatocellular disease. 2. Indeterminate hypoechoic area in the inferior right hepatic lobe which could represent a mass or focal fatty sparing. Consider follow-up MRI abdomen with contrast. 3. 6 mm likely polyp in the gallbladder, no follow-up required.  MR Abdomen w/contrast 04/05/22: IMPRESSION: Multiple small hypervascular hepatic masses, largest measuring 2.6 cm. These are suspicious for small benign hepatic adenomas, with focal nodular hyperplasia also in the differential diagnosis. Recommend follow-up by abdomen MRI in 6 months, without and with Eovist contrast. Mild hepatomegaly and diffuse steatosis. Small hiatal hernia.  MRI liver 09/13/22: IMPRESSION: 1. No significant interval change in the bilobar hepatic lesions measuring up to 2.5 cm, which again are nonspecific but almost certainly benign, with primary differential considerations of adenomas or focal nodular hyperplasia. Suggest follow-up MRI in 6 months with and without EOVIST contrast to ensure stability and potentially more definitive characterization. 2.  Hepatomegaly with diffuse hepatic steatosis.  EGD 06/15/22: - Normal esophagus. - Large hiatal hernia. - Six gastric polyps. Resected and retrieved. - Erythematous mucosa in the antrum. Biopsied. - Normal examined duodenum. Path: 1. Surgical [P], 2nd portion of duodenum - SMALL INTESTINAL MUCOSA WITHIN NORMAL LIMITS. 2. Surgical [P], gastric antrum and gastric body - ANTRAL MUCOSA WITH MILD CHRONIC INACTIVE GASTRITIS. - OXYNTIC MUCOSA WITH NO SIGNIFICANT PATHOLOGY. - NO HELICOBACTER PYLORI ORGANISMS IDENTIFIED ON H&E STAINED SLIDE. 3. Surgical [P], gastric body polyps - FUNDIC GLAND POLYPS 4. Surgical [P], random esophageal sites - SQUAMOUS MUCOSA WITH MILD BASAL CELL HYPERPLASIA, OTHERWISE NO SIGNIFICANT PATHOLOGY  Colonoscopy 06/15/22: - The examined portion of the ileum was normal. - One 7 mm polyp in the transverse colon, removed with a cold snare. Resected and retrieved. - Diverticulosis in the sigmoid colon. - Non-bleeding internal hemorrhoids. - Biopsies were taken with a cold forceps for histology in the entire colon. Path: 5. Surgical [P], colon nos, random sites - COLONIC MUCOSA WITHIN NORMAL LIMITS. 6. Surgical [P], colon, transverse, polyp (1) - SESSILE SERRATED  POLYP WITHOUT DYSPLASIA  ASSESSMENT AND PLAN: Gastroparesis GERD N&V Iron deficiency anemia Alternating constipation and diarrhea Elevated LFTs, likely due to fatty liver Patient presents with N&V that is likely due to a combination of fluctuations in blood sugars, gastroparesis, and/or constipation. Patient did not respond to a trial of high dose PPI therapy or Reglan. Prucalopride was not able to be approved by her insurance. Will trial her on Linzess therapy to see if this helps with her N&V. If so, then will need to figure out a way to switch her to another therapy or get her some pharmacy assistance with the [place]. Will recheck her LFTs, blood counts, and iron studies today to see if she still has IDA.  She previously was not able to tolerate PO iron supplements due to constipation so she may need to be referred to a hematologist for IV iron infusions in the future - Gastroparesis diet - Encourage weight loss - Check CBC, CMP, ferritin/IBC - Cont Nexium 20 mg QD - Start Linzess 145 mcg QD - MRI liver (open MRI) in 03/2023 - Repeat colonoscopy for polyp surveillance due in 06/2027 - Consider further work up of IDA with VCE in the future - Declined hepatitis A and B vaccines today - Consider referral to nutritionist - RTC in 2 months  [name], MD  I spent 36 minutes of time, including in depth chart review, independent review of results as outlined above, communicating results with the patient directly, face-to-face time with the patient, coordinating care, ordering studies and medications as appropriate, and documentation.

## 2022-10-12 NOTE — Progress Notes (Signed)
Hi Beth, please refer to hematology for IDA and consideration of IV iron infusions.  Hi Dr. Charna Archer, I do think that Ms. Peace's elevated blood glucoses are contributing to her issues with N&V. Perhaps she needs some adjustment to her diabetes regimen? Thanks.

## 2022-10-18 ENCOUNTER — Ambulatory Visit: Payer: BC Managed Care – PPO | Admitting: Medical-Surgical

## 2022-10-24 ENCOUNTER — Other Ambulatory Visit: Payer: Self-pay

## 2022-10-24 ENCOUNTER — Encounter: Payer: Self-pay | Admitting: Internal Medicine

## 2022-10-24 MED ORDER — TENAPANOR HCL 50 MG PO TABS: 1.0000 | ORAL_TABLET | Freq: Two times a day (BID) | ORAL | 3 refills | Status: DC

## 2022-10-25 ENCOUNTER — Encounter: Payer: Self-pay | Admitting: Medical-Surgical

## 2022-10-25 ENCOUNTER — Ambulatory Visit (INDEPENDENT_AMBULATORY_CARE_PROVIDER_SITE_OTHER): Payer: BC Managed Care – PPO | Admitting: Medical-Surgical

## 2022-10-25 VITALS — BP 125/75 | HR 76 | Resp 20 | Ht 65.0 in | Wt 237.8 lb

## 2022-10-25 DIAGNOSIS — Z3041 Encounter for surveillance of contraceptive pills: Secondary | ICD-10-CM | POA: Diagnosis not present

## 2022-10-25 DIAGNOSIS — K3184 Gastroparesis: Secondary | ICD-10-CM | POA: Diagnosis not present

## 2022-10-25 DIAGNOSIS — E1165 Type 2 diabetes mellitus with hyperglycemia: Secondary | ICD-10-CM | POA: Diagnosis not present

## 2022-10-25 DIAGNOSIS — F411 Generalized anxiety disorder: Secondary | ICD-10-CM

## 2022-10-25 LAB — POCT GLYCOSYLATED HEMOGLOBIN (HGB A1C): Hemoglobin A1C: 12.5 % — AB (ref 4.0–5.6)

## 2022-10-25 MED ORDER — ESOMEPRAZOLE MAGNESIUM 20 MG PO CPDR: 20.0000 mg | DELAYED_RELEASE_CAPSULE | Freq: Every day | ORAL | 1 refills | Status: DC

## 2022-10-25 MED ORDER — RYBELSUS 7 MG PO TABS: 7.0000 mg | ORAL_TABLET | Freq: Every day | ORAL | 0 refills | Status: DC

## 2022-10-25 MED ORDER — ALPRAZOLAM 0.5 MG PO TABS: ORAL_TABLET | ORAL | 0 refills | Status: DC

## 2022-10-25 MED ORDER — HAILEY 24 FE 1-20 MG-MCG(24) PO TABS: 1.0000 | ORAL_TABLET | Freq: Every day | ORAL | 4 refills | Status: DC

## 2022-10-25 NOTE — Progress Notes (Addendum)
Established Patient Office Visit  Subjective   Patient ID: Lindsey Maxwell, female   DOB: May 11, 1976 Age: 47 y.o. MRN: [id number]   Chief Complaint  Patient presents with   Follow-up   Diabetes    HPI Pleasant 47 year old female seen today for follow-up on diabetes.  Notes that she has been taking metformin 500 mg twice daily as prescribed.  She also takes glipizide 10 mg twice daily but admits to missing the afternoon dose at times.  Over the last several months, her dietary habits have had room for improvement due to increased stress and situational issues.  Notes that she has tried [place] and Sitagliptin in the past which are both on her intolerance list.  Tried Rybelsus and tolerated it fairly well however her insurance would not cover more than a 30-day supply and a 2-year span.  Not currently exercising.  Requesting refill on birth control.  She has been taking this regularly without missed doses.  Skips menstrual cycles due to severe iron deficiency.  Tolerates the medication well without side effects.  She is sexually active intermittently but reports there is no chance of pregnancy at this time.  Mood: Uses Xanax 0.5-1 tablet daily as needed.  Notes that some weeks she uses this 2-3 times during the week and others she does better.  Lost both of her dogs over the last several months and has had some significant anxiety around that.  Denies SI/HI.   Objective:    Vitals:   10/25/22 0842 10/25/22 0906  BP: (!) 142/74 125/75  Pulse: 85 76  Resp: 20 20  Height: '5\' 5"'$  (1.651 m)   Weight: 237 lb 12.8 oz (107.9 kg)   SpO2: 99% 98%  BMI (Calculated): 39.57    Physical Exam Vitals reviewed.  Constitutional:      General: She is not in acute distress.    Appearance: Normal appearance. She is obese. She is not ill-appearing.  HENT:     Head: Normocephalic and atraumatic.  Cardiovascular:     Rate and Rhythm: Normal rate and regular rhythm.     Pulses: Normal pulses.     Heart  sounds: Normal heart sounds.  Pulmonary:     Effort: Pulmonary effort is normal. No respiratory distress.     Breath sounds: Normal breath sounds. No wheezing, rhonchi or rales.  Skin:    General: Skin is warm and dry.  Neurological:     Mental Status: She is alert and oriented to person, place, and time.  Psychiatric:        Mood and Affect: Mood normal.        Behavior: Behavior normal.        Thought Content: Thought content normal.        Judgment: Judgment normal.    Results for orders placed or performed in visit on 10/25/22 (from the past 24 hour(s))  POCT HgB A1C     Status: Abnormal   Collection Time: 10/25/22  8:45 AM  Result Value Ref Range   Hemoglobin A1C 12.5 (A) 4.0 - 5.6 %   HbA1c POC (<> result, manual entry)     HbA1c, POC (prediabetic range)     HbA1c, POC (controlled diabetic range)         The 10-year ASCVD risk score (Arnett DK, et al., 2019) is: 3.5%   Values used to calculate the score:     Age: 72 years     Sex: Female     Is Non-Hispanic  African American: No     Diabetic: Yes     Tobacco smoker: No     Systolic Blood Pressure: 793 mmHg     Is BP treated: Yes     HDL Cholesterol: 28 mg/dL     Total Cholesterol: 152 mg/dL   Assessment & Plan:   1. Uncontrolled type 2 diabetes mellitus with hyperglycemia, without long-term current use of insulin (HCC) POCT hemoglobin A1c 12.5% today.  Previous was 9.8%.  Unfortunately her dietary habits have been less than stellar.  She is also forgetting to take the afternoon dose of glipizide.  Strongly recommend making sure to take glipizide twice daily.  Also highly recommend getting back on the dietary modifications that are required for diabetes patients.  Incorporate exercise as tolerated.  30-day supply of Rybelsus 3 mg given in office today.  Sending in Rybelsus 7 mg daily to reattempt insurance coverage with a new insurance year. - POCT HgB A1C  2. Anxiety state Continue very sparing use of Xanax.  Would  greatly benefit from adding an SSRI for mood management but patient would like to avoid this at this time. - ALPRAZolam (XANAX) 0.5 MG tablet; TAKE 1 TABLET BY MOUTH DAILY AS NEEDED FOR ANXIETY. TO BE USED SPARINGLY FOR SEVERE ANXIETY. NO MORE THAN 2 TO 3 TIMES WEEKLY  Dispense: 15 tablet; Refill: 0  3. Encounter for birth control pills maintenance Refilling birth control. - [name] 1-20 MG-MCG(24) tablet; Take 1 tablet by mouth daily.  Dispense: 84 tablet; Refill: 4  4. Gastroparesis Refilling Nexium 20 mg daily. - esomeprazole (NEXIUM) 20 MG capsule; Take 1 capsule (20 mg total) by mouth daily.  Dispense: 90 capsule; Refill: 1   Return in about 3 months (around 01/24/2023) for DM follow up.  ___________________________________________ Clearnce Sorrel, DNP, APRN, FNP-BC [facility]

## 2022-10-27 ENCOUNTER — Encounter: Payer: Self-pay | Admitting: Medical-Surgical

## 2022-10-28 MED ORDER — OSELTAMIVIR PHOSPHATE 75 MG PO CAPS: 75.0000 mg | ORAL_CAPSULE | Freq: Two times a day (BID) | ORAL | 0 refills | Status: DC

## 2022-11-01 ENCOUNTER — Encounter: Payer: Self-pay | Admitting: Medical-Surgical

## 2022-11-01 ENCOUNTER — Other Ambulatory Visit: Payer: Self-pay | Admitting: Medical-Surgical

## 2022-11-03 ENCOUNTER — Encounter: Payer: Self-pay | Admitting: Hematology and Oncology

## 2022-11-04 ENCOUNTER — Telehealth (HOSPITAL_COMMUNITY): Payer: Self-pay

## 2022-11-07 ENCOUNTER — Encounter: Payer: Self-pay | Admitting: Hematology and Oncology

## 2022-11-07 ENCOUNTER — Inpatient Hospital Stay: Payer: BC Managed Care – PPO

## 2022-11-07 ENCOUNTER — Inpatient Hospital Stay: Payer: BC Managed Care – PPO | Attending: Hematology and Oncology | Admitting: Hematology and Oncology

## 2022-11-07 VITALS — BP 154/68 | HR 84 | Temp 99.1°F | Resp 18 | Ht 65.0 in | Wt 239.0 lb

## 2022-11-07 DIAGNOSIS — D72829 Elevated white blood cell count, unspecified: Secondary | ICD-10-CM | POA: Insufficient documentation

## 2022-11-07 DIAGNOSIS — E1165 Type 2 diabetes mellitus with hyperglycemia: Secondary | ICD-10-CM | POA: Diagnosis not present

## 2022-11-07 DIAGNOSIS — D509 Iron deficiency anemia, unspecified: Secondary | ICD-10-CM | POA: Diagnosis not present

## 2022-11-07 DIAGNOSIS — D75839 Thrombocytosis, unspecified: Secondary | ICD-10-CM | POA: Insufficient documentation

## 2022-11-07 DIAGNOSIS — Z87891 Personal history of nicotine dependence: Secondary | ICD-10-CM

## 2022-11-07 DIAGNOSIS — Z79899 Other long term (current) drug therapy: Secondary | ICD-10-CM | POA: Diagnosis not present

## 2022-11-07 DIAGNOSIS — Z83719 Family history of colon polyps, unspecified: Secondary | ICD-10-CM | POA: Diagnosis not present

## 2022-11-07 DIAGNOSIS — Z803 Family history of malignant neoplasm of breast: Secondary | ICD-10-CM

## 2022-11-07 DIAGNOSIS — E538 Deficiency of other specified B group vitamins: Secondary | ICD-10-CM | POA: Insufficient documentation

## 2022-11-07 DIAGNOSIS — D500 Iron deficiency anemia secondary to blood loss (chronic): Secondary | ICD-10-CM

## 2022-11-07 DIAGNOSIS — D72825 Bandemia: Secondary | ICD-10-CM

## 2022-11-07 NOTE — Assessment & Plan Note (Signed)
She has chronic intermittent leukocytosis, predominantly neutrophilia pattern This is likely reactive in nature Observe only We discussed association between obesity with chronic leukocytosis and discussed importance of dietary modification and lifestyle changes

## 2022-11-07 NOTE — Assessment & Plan Note (Signed)
Likely reactive in nature due to iron deficiency Monitor closely

## 2022-11-07 NOTE — Progress Notes (Signed)
[facility] CONSULT NOTE  Patient Care Team: [name], NP as PCP - General (Nurse Practitioner) Darius Bump, [facility] (Pharmacist)  ASSESSMENT & PLAN:  Iron deficiency anemia The most likely cause of her anemia is due to chronic blood loss/malabsorption syndrome. We discussed some of the risks, benefits, and alternatives of intravenous iron infusions. The patient is symptomatic from anemia and the iron level is critically low. She tolerated oral iron supplement poorly and desires to achieved higher levels of iron faster for adequate hematopoesis. Some of the side-effects to be expected including risks of infusion reactions, phlebitis, headaches, nausea and fatigue.  The patient is willing to proceed. Patient education material was dispensed.  Goal is to keep ferritin level greater than 50 and resolution of anemia I recommend 2 doses of intravenous iron sucrose with plan to recheck iron studies again in a few months   Uncontrolled type 2 diabetes mellitus with hyperglycemia, without long-term current use of insulin ([place]) Uncontrolled diabetes is probably causing some of her gastroparesis and her medications probably prohibited adequate iron absorption I would defer to her primary care doctor for management We discussed importance of lifestyle changes I am hopeful by eliminating oral iron supplement, she would have less GI problems  Thrombocytosis Likely reactive in nature due to iron deficiency Monitor closely  Vitamin B 12 deficiency She has history of vitamin B12 deficiency Her most recent B12 level was adequate Observe only  Leukocytosis She has chronic intermittent leukocytosis, predominantly neutrophilia pattern This is likely reactive in nature Observe only We discussed association between obesity with chronic leukocytosis and discussed importance of dietary modification and lifestyle changes  Orders Placed This Encounter  Procedures   Iron and Iron Binding  Capacity ([facility] only)    Standing Status:   Future    Standing Expiration Date:   11/08/2023   Ferritin    Standing Status:   Future    Standing Expiration Date:   11/07/2023   CBC with Differential (Cancer Center Only)    Standing Status:   Future    Standing Expiration Date:   11/08/2023    All questions were answered. The patient knows to call the clinic with any problems, questions or concerns.  The total time spent in the appointment was 60 minutes encounter with patients including review of chart and various tests results, discussions about plan of care and coordination of care plan  Heath Lark, MD 1/29/20243:42 PM   CHIEF COMPLAINTS/PURPOSE OF CONSULTATION:  Anemia  HISTORY OF PRESENTING ILLNESS:  Lindsey Maxwell 47 y.o. female is here because of anemia  She was found to have abnormal CBC from recent blood work I have the opportunity to to review her blood count from electronic records Between September 2022 to recent blood work, her hemoglobin fluctuated from 9.6-11.8 It is accompanied by microcytosis, intermittent leukocytosis and thrombocytosis The patient was treated by a hematologist in another facility around 2019 where she received intravenous iron infusion She also had extensive workup for leukocytosis She also have remote history of B12 deficiency but that was adequately treated Currently, she complains of fatigue.  She has occasional restless leg and occasional dizziness The patient was diagnosed with gastroparesis and she has difficulties with constipation She has been taking oral iron supplement for some time She had remote history of significant blood donation until she can no longer donate blood several years ago The patient had EGD and colonoscopy in September 2023 with no signs of active GI bleed but has  frequent chronic intermittent internal hemorrhoidal bleeding after bowel movement When she takes oral iron supplement, it exacerbate her constipation causing  worsening hemorrhoidal bleeding. She has been put on birth control pill for PCOS for the last 10 years and she has light menstrual cycle The patient denies over the counter NSAID ingestion. She is not on antiplatelets agents. She has poorly controlled diabetes  MEDICAL HISTORY:  Past Medical History:  Diagnosis Date   Anemia    Anxiety    Bell's palsy    Diabetes mellitus without complication (HCC)    GERD (gastroesophageal reflux disease)    Hypercalcemia    Hypertension    Obesity    PCOS (polycystic ovarian syndrome)     SURGICAL HISTORY: Past Surgical History:  Procedure Laterality Date   UPPER GASTROINTESTINAL ENDOSCOPY      SOCIAL HISTORY: Social History   Socioeconomic History   Marital status: Divorced    Spouse name: Not on file   Number of children: Not on file   Years of education: Not on file   Highest education level: Not on file  Occupational History   Not on file  Tobacco Use   Smoking status: Former    Types: Cigarettes    Quit date: 06/22/2011    Years since quitting: 11.3   Smokeless tobacco: Never  Vaping Use   Vaping Use: Former  Substance and Sexual Activity   Alcohol use: Yes    Comment: occasionally   Drug use: Never   Sexual activity: Yes    Birth control/protection: OCP  Other Topics Concern   Not on file  Social History Narrative   Not on file   Social Determinants of Health   Financial Resource Strain: Not on file  Food Insecurity: Not on file  Transportation Needs: Not on file  Physical Activity: Not on file  Stress: Not on file  Social Connections: Not on file  Intimate Partner Violence: Not on file    FAMILY HISTORY: Family History  Problem Relation Age of Onset   Cancer Mother        breast CA   Diabetes Mother    Colon polyps Mother    Irritable bowel syndrome Mother    Hypertension Father    Diabetes Father    Heart attack Brother 31   Diabetes Maternal Grandmother    Diabetes Maternal Grandfather     Diabetes Paternal Grandmother    Diabetes Paternal Grandfather    Colon cancer Neg Hx    Esophageal cancer Neg Hx    Rectal cancer Neg Hx    Stomach cancer Neg Hx     ALLERGIES:  is allergic to sitagliptin, amlodipine, dapagliflozin, losartan, metformin, metoprolol, semaglutide, venlafaxine, atorvastatin, and clonidine.  MEDICATIONS:  Current Outpatient Medications  Medication Sig Dispense Refill   ALPRAZolam (XANAX) 0.5 MG tablet TAKE 1 TABLET BY MOUTH DAILY AS NEEDED FOR ANXIETY. TO BE USED SPARINGLY FOR SEVERE ANXIETY. NO MORE THAN 2 TO 3 TIMES WEEKLY 15 tablet 0   clotrimazole-betamethasone (LOTRISONE) cream Apply 1 application. topically daily. 30 g 0   esomeprazole (NEXIUM) 20 MG capsule Take 1 capsule (20 mg total) by mouth daily. 90 capsule 1   glipiZIDE (GLUCOTROL) 10 MG tablet Take 1 tablet (10 mg total) by mouth 2 (two) times daily before a meal. 180 tablet 1   [name] 1-20 MG-MCG(24) tablet Take 1 tablet by mouth daily. 84 tablet 4   hydrocortisone (ANUSOL-HC) 2.5 % rectal cream Place 1 Application rectally 2 (two)  times daily. 30 g 1   meloxicam (MOBIC) 15 MG tablet Take 1 tablet (15 mg total) by mouth daily as needed for pain. 90 tablet 1   metFORMIN (GLUCOPHAGE-XR) 500 MG 24 hr tablet TAKE ONE TABLET BY MOUTH TWICE A DAY BEFORE A MEAL 180 tablet 0   ondansetron (ZOFRAN-ODT) 8 MG disintegrating tablet Take 1 tablet (8 mg total) by mouth every 8 (eight) hours as needed for nausea or vomiting. 24 tablet 0   rosuvastatin (CRESTOR) 20 MG tablet TAKE 1 TABLET(20 MG) BY MOUTH DAILY 90 tablet 1   Tenapanor HCl 50 MG TABS Take 1 tablet by mouth 2 (two) times daily. 60 tablet 3   valsartan-hydrochlorothiazide (DIOVAN-HCT) 320-25 MG tablet Take 1 tablet by mouth daily. 90 tablet 1   No current facility-administered medications for this visit.    REVIEW OF SYSTEMS:   Constitutional: Denies fevers, chills or abnormal night sweats Eyes: Denies blurriness of vision, double vision  or watery eyes Ears, nose, mouth, throat, and face: Denies mucositis or sore throat Respiratory: Denies cough, dyspnea or wheezes Cardiovascular: Denies palpitation, chest discomfort or lower extremity swelling Gastrointestinal:  Denies nausea, heartburn or change in bowel habits Skin: Denies abnormal skin rashes Lymphatics: Denies new lymphadenopathy or easy bruising Neurological:Denies numbness, tingling or new weaknesses Behavioral/Psych: Mood is stable, no new changes  All other systems were reviewed with the patient and are negative.  PHYSICAL EXAMINATION: ECOG PERFORMANCE STATUS: 1 - Symptomatic but completely ambulatory  Vitals:   11/07/22 1248  BP: (!) 154/68  Pulse: 84  Resp: 18  Temp: 99.1 F (37.3 C)  SpO2: 99%   Filed Weights   11/07/22 1248  Weight: 239 lb (108.4 kg)    GENERAL:alert, no distress and comfortable SKIN: skin color, texture, turgor are normal, no rashes or significant lesions EYES: normal, conjunctiva are pink and non-injected, sclera clear OROPHARYNX:no exudate, no erythema and lips, buccal mucosa, and tongue normal  NECK: supple, thyroid normal size, non-tender, without nodularity LYMPH:  no palpable lymphadenopathy in the cervical, axillary or inguinal LUNGS: clear to auscultation and percussion with normal breathing effort HEART: regular rate & rhythm and no murmurs and no lower extremity edema ABDOMEN:abdomen soft, non-tender and normal bowel sounds Musculoskeletal:no cyanosis of digits and no clubbing  PSYCH: alert & oriented x 3 with fluent speech NEURO: no focal motor/sensory deficits  RADIOGRAPHIC STUDIES: I have personally reviewed the radiological images as listed and agreed with the findings in the report. No results found.

## 2022-11-07 NOTE — Assessment & Plan Note (Signed)
The most likely cause of her anemia is due to chronic blood loss/malabsorption syndrome. We discussed some of the risks, benefits, and alternatives of intravenous iron infusions. The patient is symptomatic from anemia and the iron level is critically low. She tolerated oral iron supplement poorly and desires to achieved higher levels of iron faster for adequate hematopoesis. Some of the side-effects to be expected including risks of infusion reactions, phlebitis, headaches, nausea and fatigue.  The patient is willing to proceed. Patient education material was dispensed.  Goal is to keep ferritin level greater than 50 and resolution of anemia I recommend 2 doses of intravenous iron sucrose with plan to recheck iron studies again in a few months

## 2022-11-07 NOTE — Assessment & Plan Note (Signed)
Uncontrolled diabetes is probably causing some of her gastroparesis and her medications probably prohibited adequate iron absorption I would defer to her primary care doctor for management We discussed importance of lifestyle changes I am hopeful by eliminating oral iron supplement, she would have less GI problems

## 2022-11-07 NOTE — Assessment & Plan Note (Signed)
She has history of vitamin B12 deficiency Her most recent B12 level was adequate Observe only

## 2022-11-15 ENCOUNTER — Encounter: Payer: Self-pay | Admitting: Hematology and Oncology

## 2022-11-15 ENCOUNTER — Other Ambulatory Visit (HOSPITAL_COMMUNITY): Payer: Self-pay

## 2022-11-15 ENCOUNTER — Telehealth: Payer: Self-pay | Admitting: Pharmacy Technician

## 2022-11-15 NOTE — Telephone Encounter (Signed)
Patient Advocate Encounter  Prior Authorization for IBSRELA '50MG'$  has been approved.    PA# [id number] Effective dates: 2.6.24 through 2.5.25    Received notification from [employer] that prior authorization for [facility] '50MG'$  is required.   PA submitted on 2.6.24 Key [id number]  Status is pending

## 2022-11-18 ENCOUNTER — Other Ambulatory Visit: Payer: Self-pay

## 2022-11-18 ENCOUNTER — Inpatient Hospital Stay: Payer: BC Managed Care – PPO | Attending: Hematology and Oncology

## 2022-11-18 VITALS — BP 158/85 | HR 75 | Temp 98.2°F | Resp 19

## 2022-11-18 DIAGNOSIS — D509 Iron deficiency anemia, unspecified: Secondary | ICD-10-CM | POA: Insufficient documentation

## 2022-11-18 DIAGNOSIS — D500 Iron deficiency anemia secondary to blood loss (chronic): Secondary | ICD-10-CM

## 2022-11-18 MED ORDER — SODIUM CHLORIDE 0.9 % IV SOLN: Freq: Once | INTRAVENOUS | Status: AC

## 2022-11-18 MED ORDER — SODIUM CHLORIDE 0.9 % IV SOLN
400.0000 mg | Freq: Once | INTRAVENOUS | Status: AC
  Administered 2022-11-18: 400 mg via INTRAVENOUS
  Filled 2022-11-18: qty 20

## 2022-11-18 NOTE — Patient Instructions (Signed)

## 2022-11-18 NOTE — Progress Notes (Signed)
Pt observed for 48mns post venofer infusion. Pt tolerated tx well with no complaints. Pt verbalized understanding of possible side effects. VSS at discharge. Pt ambulatory to lobby w/o complaints.

## 2022-11-25 ENCOUNTER — Inpatient Hospital Stay: Payer: BC Managed Care – PPO

## 2022-11-25 VITALS — BP 156/80 | HR 77 | Temp 97.9°F | Resp 18

## 2022-11-25 DIAGNOSIS — D509 Iron deficiency anemia, unspecified: Secondary | ICD-10-CM | POA: Diagnosis not present

## 2022-11-25 DIAGNOSIS — D500 Iron deficiency anemia secondary to blood loss (chronic): Secondary | ICD-10-CM

## 2022-11-25 MED ORDER — SODIUM CHLORIDE 0.9 % IV SOLN: Freq: Once | INTRAVENOUS | Status: AC

## 2022-11-25 MED ORDER — SODIUM CHLORIDE 0.9 % IV SOLN
400.0000 mg | Freq: Once | INTRAVENOUS | Status: AC
  Administered 2022-11-25: 400 mg via INTRAVENOUS
  Filled 2022-11-25: qty 20

## 2022-11-25 NOTE — Patient Instructions (Signed)

## 2022-12-09 ENCOUNTER — Other Ambulatory Visit: Payer: Self-pay | Admitting: Pharmacist

## 2022-12-09 NOTE — Progress Notes (Signed)
Patient appearing on report for elevated A1c.  Outreached patient to discuss glucose control and medication management. Left voicemail for patient to return my call at their convenience.   Larinda Buttery, PharmD Clinical Pharmacist [facility] [phone]

## 2022-12-20 ENCOUNTER — Encounter: Payer: Self-pay | Admitting: Internal Medicine

## 2022-12-20 ENCOUNTER — Ambulatory Visit (INDEPENDENT_AMBULATORY_CARE_PROVIDER_SITE_OTHER): Payer: BC Managed Care – PPO | Admitting: Internal Medicine

## 2022-12-20 ENCOUNTER — Other Ambulatory Visit (INDEPENDENT_AMBULATORY_CARE_PROVIDER_SITE_OTHER): Payer: BC Managed Care – PPO

## 2022-12-20 VITALS — BP 164/68 | HR 86 | Ht 65.0 in | Wt 242.0 lb

## 2022-12-20 DIAGNOSIS — K3184 Gastroparesis: Secondary | ICD-10-CM

## 2022-12-20 DIAGNOSIS — K219 Gastro-esophageal reflux disease without esophagitis: Secondary | ICD-10-CM

## 2022-12-20 DIAGNOSIS — K769 Liver disease, unspecified: Secondary | ICD-10-CM

## 2022-12-20 LAB — CBC WITH DIFFERENTIAL/PLATELET
Basophils Absolute: 0.2 10*3/uL — ABNORMAL HIGH (ref 0.0–0.1)
Basophils Relative: 1.5 % (ref 0.0–3.0)
Eosinophils Absolute: 0.2 10*3/uL (ref 0.0–0.7)
Eosinophils Relative: 1.5 % (ref 0.0–5.0)
HCT: 35.6 % — ABNORMAL LOW (ref 36.0–46.0)
Hemoglobin: 11.5 g/dL — ABNORMAL LOW (ref 12.0–15.0)
Lymphocytes Relative: 18 % (ref 12.0–46.0)
Lymphs Abs: 2.3 10*3/uL (ref 0.7–4.0)
MCHC: 32.4 g/dL (ref 30.0–36.0)
MCV: 80.4 fl (ref 78.0–100.0)
Monocytes Absolute: 0.4 10*3/uL (ref 0.1–1.0)
Monocytes Relative: 3.1 % (ref 3.0–12.0)
Neutro Abs: 9.9 10*3/uL — ABNORMAL HIGH (ref 1.4–7.7)
Neutrophils Relative %: 75.9 % (ref 43.0–77.0)
Platelets: 433 10*3/uL — ABNORMAL HIGH (ref 150.0–400.0)
RBC: 4.43 Mil/uL (ref 3.87–5.11)
RDW: 23 % — ABNORMAL HIGH (ref 11.5–15.5)
WBC: 13 10*3/uL — ABNORMAL HIGH (ref 4.0–10.5)

## 2022-12-20 LAB — IBC + FERRITIN
Ferritin: 27.7 ng/mL (ref 10.0–291.0)
Iron: 62 ug/dL (ref 42–145)
Saturation Ratios: 11.8 % — ABNORMAL LOW (ref 20.0–50.0)
TIBC: 526.4 ug/dL — ABNORMAL HIGH (ref 250.0–450.0)
Transferrin: 376 mg/dL — ABNORMAL HIGH (ref 212.0–360.0)

## 2022-12-20 MED ORDER — ESOMEPRAZOLE MAGNESIUM 20 MG PO CPDR: 20.0000 mg | DELAYED_RELEASE_CAPSULE | Freq: Every day | ORAL | 1 refills | Status: DC

## 2022-12-20 NOTE — Progress Notes (Signed)
Chief Complaint: Gastroparesis  HPI : 47 year old female with history of DM, gastroparesis, and PCOS presents for follow up of gastroparesis  Interval History: Overall she is feeling better. She stopped her Rybelsus about 1 month ago, and she started her iron infusions last month. She has had 2 iron infusions so far and will have another infusion in 02/2022. With these two changes, her bowel habits have improved. Denies constipation. She did not have to start tenapanor medication. She has not had to take Zofran for the last month. Denies N&V. When she eats lettuce, it may cause some loose stool, but this has been the case in the past. Blood sugars have been up-and-down. She has gained some weight. Acid reflux has been under control with Nexium. Denies abdominal pain. Has tiny amounts of rectal bleeding. She does still have some steroid cream at home.  Wt Readings from Last 3 Encounters:  12/20/22 242 lb (109.8 kg)  11/07/22 239 lb (108.4 kg)  10/25/22 237 lb 12.8 oz (107.9 kg)    Current Outpatient Medications  Medication Sig Dispense Refill   ALPRAZolam (XANAX) 0.5 MG tablet TAKE 1 TABLET BY MOUTH DAILY AS NEEDED FOR ANXIETY. TO BE USED SPARINGLY FOR SEVERE ANXIETY. NO MORE THAN 2 TO 3 TIMES WEEKLY 15 tablet 0   clotrimazole-betamethasone (LOTRISONE) cream Apply 1 application. topically daily. 30 g 0   esomeprazole (NEXIUM) 20 MG capsule Take 1 capsule (20 mg total) by mouth daily. 90 capsule 1   glipiZIDE (GLUCOTROL) 10 MG tablet Take 1 tablet (10 mg total) by mouth 2 (two) times daily before a meal. 180 tablet 1   [name] 1-20 MG-MCG(24) tablet Take 1 tablet by mouth daily. 84 tablet 4   hydrocortisone (ANUSOL-HC) 2.5 % rectal cream Place 1 Application rectally 2 (two) times daily. 30 g 1   meloxicam (MOBIC) 15 MG tablet Take 1 tablet (15 mg total) by mouth daily as needed for pain. 90 tablet 1   metFORMIN (GLUCOPHAGE-XR) 500 MG 24 hr tablet TAKE ONE TABLET BY MOUTH TWICE A DAY BEFORE  A MEAL 180 tablet 0   ondansetron (ZOFRAN-ODT) 8 MG disintegrating tablet Take 1 tablet (8 mg total) by mouth every 8 (eight) hours as needed for nausea or vomiting. 24 tablet 0   rosuvastatin (CRESTOR) 20 MG tablet TAKE 1 TABLET(20 MG) BY MOUTH DAILY 90 tablet 1   Tenapanor HCl 50 MG TABS Take 1 tablet by mouth 2 (two) times daily. 60 tablet 3   valsartan-hydrochlorothiazide (DIOVAN-HCT) 320-25 MG tablet Take 1 tablet by mouth daily. 90 tablet 1   No current facility-administered medications for this visit.   Physical Exam: BP (!) 164/68   Pulse 86   Ht '5\' 5"'$  (1.651 m)   Wt 242 lb (109.8 kg)   SpO2 98%   BMI 40.27 kg/m  Constitutional: Pleasant,well-developed, female in no acute distress. HEENT: Normocephalic and atraumatic. Conjunctivae are normal. No scleral icterus. Cardiovascular: Normal rate Pulmonary/chest: No increased WOB Abdominal: Soft, nondistended, nontender. Bowel sounds active throughout. There are no masses palpable. No hepatomegaly. Extremities: No edema Neurological: Alert and oriented to person place and time. Skin: Skin is warm and dry. No rashes noted. Psychiatric: Normal mood and affect. Behavior is normal.  Labs 11/2021: CBC with low Hb of 10.4, MCV 77.4. CMP with elevated alk phos of 130, AST of 47, elevated glucose of 268.  Labs 03/2022: CBC with elevated WBC of 11.7, low Hb of 9.6, and elevated plts of 443. TSH  nml. Lipase nml. Ferritin/IBC with low ferritin of 5 and low iron sat of 3.4%.   Labs 06/2022: CMP with elevated glucose and elevated alk phos of 143.   Labs 07/2022: Hep A Ab NR. Hep B surface antigen NR. Hep B surface antibody NR. IgG nml. ASMA negative. ANA negative. AMA negative. INR nml.   Labs 10/2022: CMP with elevated glucose of 258 and elevated alk phos of 122. Ferritin low at 8.6 and low saturation sat of 9.8%. CBC with low Hb of 10.5 and elevated WBC of 14.9 as well as elevated plts of 527.   Gastric emptying study  09/19/14: IMPRESSION: Gastric retention at 120 min is 36%. Normal retention at 120 min is less than 30%.  KUB 07/08/21: IMPRESSION: Negative.  RUQ U/S 03/25/22: IMPRESSION: 1. Hepatomegaly with abnormal appearing parenchyma suggesting hepatic steatosis and/or other hepatocellular disease. 2. Indeterminate hypoechoic area in the inferior right hepatic lobe which could represent a mass or focal fatty sparing. Consider follow-up MRI abdomen with contrast. 3. 6 mm likely polyp in the gallbladder, no follow-up required.  MR Abdomen w/contrast 04/05/22: IMPRESSION: Multiple small hypervascular hepatic masses, largest measuring 2.6 cm. These are suspicious for small benign hepatic adenomas, with focal nodular hyperplasia also in the differential diagnosis. Recommend follow-up by abdomen MRI in 6 months, without and with Eovist contrast. Mild hepatomegaly and diffuse steatosis. Small hiatal hernia.  MRI liver 09/13/22: IMPRESSION: 1. No significant interval change in the bilobar hepatic lesions measuring up to 2.5 cm, which again are nonspecific but almost certainly benign, with primary differential considerations of adenomas or focal nodular hyperplasia. Suggest follow-up MRI in 6 months with and without EOVIST contrast to ensure stability and potentially more definitive characterization. 2. Hepatomegaly with diffuse hepatic steatosis.  EGD 06/15/22: - Normal esophagus. - Large hiatal hernia. - Six gastric polyps. Resected and retrieved. - Erythematous mucosa in the antrum. Biopsied. - Normal examined duodenum. Path: 1. Surgical [P], 2nd portion of duodenum - SMALL INTESTINAL MUCOSA WITHIN NORMAL LIMITS. 2. Surgical [P], gastric antrum and gastric body - ANTRAL MUCOSA WITH MILD CHRONIC INACTIVE GASTRITIS. - OXYNTIC MUCOSA WITH NO SIGNIFICANT PATHOLOGY. - NO HELICOBACTER PYLORI ORGANISMS IDENTIFIED ON H&E STAINED SLIDE. 3. Surgical [P], gastric body polyps - FUNDIC GLAND POLYPS 4.  Surgical [P], random esophageal sites - SQUAMOUS MUCOSA WITH MILD BASAL CELL HYPERPLASIA, OTHERWISE NO SIGNIFICANT PATHOLOGY  Colonoscopy 06/15/22: - The examined portion of the ileum was normal. - One 7 mm polyp in the transverse colon, removed with a cold snare. Resected and retrieved. - Diverticulosis in the sigmoid colon. - Non-bleeding internal hemorrhoids. - Biopsies were taken with a cold forceps for histology in the entire colon. Path: 5. Surgical [P], colon nos, random sites - COLONIC MUCOSA WITHIN NORMAL LIMITS. 6. Surgical [P], colon, transverse, polyp (1) - SESSILE SERRATED POLYP WITHOUT DYSPLASIA  ASSESSMENT AND PLAN: Gastroparesis GERD N&V - resolved Iron deficiency anemia Alternating constipation and diarrhea - improved Elevated LFTs, likely due to fatty liver Hepatic lesions Patient has had a significant improvement in her constipation, diarrhea, and N&V after stopping her Rybelsus medication. Her GERD has been under good control. On last check of her CBC and iron levels, she still had IDA. She has started iron infusion so will see if this has improved her iron levels. If IDA is not improved, could consider further work up with VCE in the future. Her elevated liver tests have previously been attributed to fatty liver. She has liver lesions that are suspected to be benign (  we are following with imaging).   - Gastroparesis diet - Encourage weight loss - Check CBC, ferritin/IBC - Cont Nexium 20 mg QD - MRI liver (open MRI) in 03/2023 - Repeat colonoscopy for polyp surveillance due in 06/2027 - Consider further work up of IDA with VCE in the future - Previously declined hepatitis A and B vaccines - Consider referral to nutritionist in the future to help with blood sugar management and weight loss - RTC in 4 months  [name], MD  I spent 30 minutes of time, including in depth chart review, independent review of results as outlined above, communicating results with  the patient directly, face-to-face time with the patient, coordinating care, ordering studies and medications as appropriate, and documentation.

## 2022-12-20 NOTE — Patient Instructions (Addendum)
Your provider has requested that you go to the basement level for lab work before leaving today. Press "B" on the elevator. The lab is located at the first door on the left as you exit the elevator.   We have sent the following medications to your pharmacy for you to pick up at your convenience: Nexium  You are schedule for follow up visit on 04/24/23 at 8:30 am   If your blood pressure at your visit was 140/90 or greater, please contact your primary care physician to follow up on this.  If you are age 17 or older, your body mass index should be between 23-30. Your Body mass index is 40.27 kg/m. If this is out of the aforementioned range listed, please consider follow up with your Primary Care Provider.  If you are age 39 or younger, your body mass index should be between 19-25. Your Body mass index is 40.27 kg/m. If this is out of the aformentioned range listed, please consider follow up with your Primary Care Provider.   The [facility] providers would like to encourage you to use [facility] to communicate with providers for non-urgent requests or questions.  Due to long hold times on the telephone, sending your provider a message by [facility] may be a faster and more efficient way to get a response.  Please allow 48 business hours for a response.  Please remember that this is for non-urgent requests.   Due to recent changes in healthcare laws, you may see the results of your imaging and laboratory studies on MyChart before your provider has had a chance to review them.  We understand that in some cases there may be results that are confusing or concerning to you. Not all laboratory results come back in the same time frame and the provider may be waiting for multiple results in order to interpret others.  Please give [place] 48 hours in order for your provider to thoroughly review all the results before contacting the office for clarification of your results.    Thank you for entrusting me with your care and  choosing [facility].  Dr. [name]

## 2022-12-21 ENCOUNTER — Other Ambulatory Visit: Payer: Self-pay | Admitting: Medical-Surgical

## 2022-12-21 DIAGNOSIS — E1165 Type 2 diabetes mellitus with hyperglycemia: Secondary | ICD-10-CM

## 2022-12-21 DIAGNOSIS — I10 Essential (primary) hypertension: Secondary | ICD-10-CM

## 2023-01-02 ENCOUNTER — Encounter: Payer: Self-pay | Admitting: Medical-Surgical

## 2023-01-05 ENCOUNTER — Encounter: Payer: Self-pay | Admitting: Medical-Surgical

## 2023-01-05 MED ORDER — MEDROXYPROGESTERONE ACETATE 10 MG PO TABS: 10.0000 mg | ORAL_TABLET | Freq: Every day | ORAL | 0 refills | Status: DC

## 2023-01-11 ENCOUNTER — Other Ambulatory Visit: Payer: Self-pay | Admitting: Medical-Surgical

## 2023-01-24 ENCOUNTER — Ambulatory Visit: Payer: BC Managed Care – PPO | Admitting: Medical-Surgical

## 2023-01-24 DIAGNOSIS — E1165 Type 2 diabetes mellitus with hyperglycemia: Secondary | ICD-10-CM

## 2023-02-13 ENCOUNTER — Inpatient Hospital Stay: Payer: BC Managed Care – PPO | Attending: Hematology and Oncology

## 2023-02-13 DIAGNOSIS — Z79899 Other long term (current) drug therapy: Secondary | ICD-10-CM | POA: Diagnosis not present

## 2023-02-13 DIAGNOSIS — D75839 Thrombocytosis, unspecified: Secondary | ICD-10-CM | POA: Diagnosis not present

## 2023-02-13 DIAGNOSIS — N921 Excessive and frequent menstruation with irregular cycle: Secondary | ICD-10-CM | POA: Diagnosis present

## 2023-02-13 DIAGNOSIS — D509 Iron deficiency anemia, unspecified: Secondary | ICD-10-CM | POA: Diagnosis present

## 2023-02-13 DIAGNOSIS — D500 Iron deficiency anemia secondary to blood loss (chronic): Secondary | ICD-10-CM

## 2023-02-13 LAB — CBC WITH DIFFERENTIAL (CANCER CENTER ONLY)
Abs Immature Granulocytes: 0.09 10*3/uL — ABNORMAL HIGH (ref 0.00–0.07)
Basophils Absolute: 0.1 10*3/uL (ref 0.0–0.1)
Basophils Relative: 1 %
Eosinophils Absolute: 0.3 10*3/uL (ref 0.0–0.5)
Eosinophils Relative: 3 %
HCT: 35.6 % — ABNORMAL LOW (ref 36.0–46.0)
Hemoglobin: 11.4 g/dL — ABNORMAL LOW (ref 12.0–15.0)
Immature Granulocytes: 1 %
Lymphocytes Relative: 20 %
Lymphs Abs: 2.2 10*3/uL (ref 0.7–4.0)
MCH: 26.5 pg (ref 26.0–34.0)
MCHC: 32 g/dL (ref 30.0–36.0)
MCV: 82.6 fL (ref 80.0–100.0)
Monocytes Absolute: 0.5 10*3/uL (ref 0.1–1.0)
Monocytes Relative: 5 %
Neutro Abs: 7.7 10*3/uL (ref 1.7–7.7)
Neutrophils Relative %: 70 %
Platelet Count: 436 10*3/uL — ABNORMAL HIGH (ref 150–400)
RBC: 4.31 MIL/uL (ref 3.87–5.11)
RDW: 15.2 % (ref 11.5–15.5)
WBC Count: 10.8 10*3/uL — ABNORMAL HIGH (ref 4.0–10.5)
nRBC: 0 % (ref 0.0–0.2)

## 2023-02-13 LAB — IRON AND IRON BINDING CAPACITY (CC-WL,HP ONLY)
Iron: 34 ug/dL (ref 28–170)
Saturation Ratios: 6 % — ABNORMAL LOW (ref 10.4–31.8)
TIBC: 559 ug/dL — ABNORMAL HIGH (ref 250–450)
UIBC: 525 ug/dL — ABNORMAL HIGH (ref 148–442)

## 2023-02-13 LAB — FERRITIN: Ferritin: 9 ng/mL — ABNORMAL LOW (ref 11–307)

## 2023-02-16 ENCOUNTER — Inpatient Hospital Stay (HOSPITAL_BASED_OUTPATIENT_CLINIC_OR_DEPARTMENT_OTHER): Payer: BC Managed Care – PPO | Admitting: Hematology and Oncology

## 2023-02-16 ENCOUNTER — Other Ambulatory Visit: Payer: Self-pay

## 2023-02-16 ENCOUNTER — Encounter: Payer: Self-pay | Admitting: Hematology and Oncology

## 2023-02-16 VITALS — BP 148/88 | HR 81 | Temp 99.2°F | Resp 16 | Wt 242.2 lb

## 2023-02-16 DIAGNOSIS — N921 Excessive and frequent menstruation with irregular cycle: Secondary | ICD-10-CM

## 2023-02-16 DIAGNOSIS — D500 Iron deficiency anemia secondary to blood loss (chronic): Secondary | ICD-10-CM

## 2023-02-16 DIAGNOSIS — D509 Iron deficiency anemia, unspecified: Secondary | ICD-10-CM | POA: Diagnosis not present

## 2023-02-16 DIAGNOSIS — D75839 Thrombocytosis, unspecified: Secondary | ICD-10-CM

## 2023-02-16 NOTE — Assessment & Plan Note (Signed)
The most likely cause of her anemia is due to chronic blood loss/malabsorption syndrome. We discussed some of the risks, benefits, and alternatives of intravenous iron infusions. The patient is symptomatic from anemia and the iron level is critically low. She tolerated oral iron supplement poorly and desires to achieved higher levels of iron faster for adequate hematopoesis. Some of the side-effects to be expected including risks of infusion reactions, phlebitis, headaches, nausea and fatigue.  The patient is willing to proceed. Patient education material was dispensed.  Goal is to keep ferritin level greater than 50 and resolution of anemia I recommend 3 doses of intravenous iron sucrose She has frequent follow-up with her primary care doctor and I recommend repeat blood count and iron studies in about 3 months That could save her a trip to come back here just for lab work She will update me with results of her blood work, to see if she needs further treatment in the future

## 2023-02-16 NOTE — Assessment & Plan Note (Signed)
Likely reactive in nature due to iron deficiency Monitor closely 

## 2023-02-16 NOTE — Assessment & Plan Note (Signed)
She had recent menorrhagia and I suspect that is the reason why she has persistent anemia despite 2 doses of intravenous iron infusion If this recurred again, I recommend further workup with gynecologist including pelvic ultrasound to rule out uterine fibroids It is possible that we can also prescribe Lupron injection in the future to stop her menstruation if she has uterine fibroids

## 2023-02-16 NOTE — Progress Notes (Signed)
[facility] OFF[facility] PROGRESS NOTE  Lindsey Butter, NP  ASSESSMENT & PLAN:  Iron deficiency anemia The most likely cause of her anemia is due to chronic blood loss/malabsorption syndrome. We discussed some of the risks, benefits, and alternatives of intravenous iron infusions. The patient is symptomatic from anemia and the iron level is critically low. She tolerated oral iron supplement poorly and desires to achieved higher levels of iron faster for adequate hematopoesis. Some of the side-effects to be expected including risks of infusion reactions, phlebitis, headaches, nausea and fatigue.  The patient is willing to proceed. Patient education material was dispensed.  Goal is to keep ferritin level greater than 50 and resolution of anemia I recommend 3 doses of intravenous iron sucrose She has frequent follow-up with her primary care doctor and I recommend repeat blood count and iron studies in about 3 months That could save her a trip to come back here just for lab work She will update me with results of her blood work, to see if she needs further treatment in the future  Thrombocytosis Likely reactive in nature due to iron deficiency Monitor closely  Menorrhagia with irregular cycle She had recent menorrhagia and I suspect that is the reason why she has persistent anemia despite 2 doses of intravenous iron infusion If this recurred again, I recommend further workup with gynecologist including pelvic ultrasound to rule out uterine fibroids It is possible that we can also prescribe Lupron injection in the future to stop her menstruation if she has uterine fibroids  No orders of the defined types were placed in this encounter.   The total time spent in the appointment was 20 minutes encounter with patients including review of chart and various tests results, discussions about plan of care and coordination of care plan   All questions were answered. The patient knows to call  the clinic with any problems, questions or concerns. No barriers to learning was detected.    Lindsey Delay, MD 5/9/202410:23 AM  INTERVAL HISTORY: [facility] 47 y.o. female returns for review of test results and to discuss further treatment for recurrent iron deficiency anemia She tolerated recent iron infusion well She had 1 episode of diffuse irregular menstruation lasting for 1-1/2 months and her primary care doctor prescribed Provera to stop the menstrual cycle She has not been back to see gynecologist She complained of fatigue and recently cramps  SUMMARY OF HEMATOLOGIC HISTORY:  She was found to have abnormal CBC from recent blood work I have the opportunity to to review her blood count from electronic records Between September 2022 to recent blood work, her hemoglobin fluctuated from 9.6-11.8 It is accompanied by microcytosis, intermittent leukocytosis and thrombocytosis The patient was treated by a hematologist in another facility around 2019 where she received intravenous iron infusion She also had extensive workup for leukocytosis She also have remote history of B12 deficiency but that was adequately treated Currently, she complains of fatigue.  She has occasional restless leg and occasional dizziness The patient was diagnosed with gastroparesis and she has difficulties with constipation She has been taking oral iron supplement for some time She had remote history of significant blood donation until she can no longer donate blood several years ago The patient had EGD and colonoscopy in September 2023 with no signs of active GI bleed but has frequent chronic intermittent internal hemorrhoidal bleeding after bowel movement When she takes oral iron supplement, it exacerbate her constipation causing worsening hemorrhoidal bleeding. She has been  put on birth control pill for PCOS for the last 10 years and she has light menstrual cycle The patient denies over the counter NSAID  ingestion. She is not on antiplatelets agents. She has poorly controlled diabetes She received 2 doses of intravenous iron sucrose 400 mg in February 2024  I have reviewed the past medical history, past surgical history, social history and family history with the patient and they are unchanged from previous note.  ALLERGIES:  is allergic to sitagliptin, amlodipine, dapagliflozin, losartan, metformin, metoprolol, semaglutide, venlafaxine, atorvastatin, and clonidine.  MEDICATIONS:  Current Outpatient Medications  Medication Sig Dispense Refill   ALPRAZolam (XANAX) 0.5 MG tablet TAKE 1 TABLET BY MOUTH DAILY AS NEEDED FOR ANXIETY. TO BE USED SPARINGLY FOR SEVERE ANXIETY. NO MORE THAN 2 TO 3 TIMES WEEKLY 15 tablet 0   clotrimazole-betamethasone (LOTRISONE) cream Apply 1 application. topically daily. 30 g 0   esomeprazole (NEXIUM) 20 MG capsule Take 1 capsule (20 mg total) by mouth daily. 90 capsule 1   glipiZIDE (GLUCOTROL) 10 MG tablet TAKE 1 TABLET BY MOUTH 2 TIMES DAILY BEFORE A MEAL 180 tablet 1   [name] 1-20 MG-MCG(24) tablet Take 1 tablet by mouth daily. 84 tablet 4   hydrocortisone (ANUSOL-HC) 2.5 % rectal cream Place 1 Application rectally 2 (two) times daily. 30 g 1   meloxicam (MOBIC) 15 MG tablet Take 1 tablet (15 mg total) by mouth daily as needed for pain. 90 tablet 1   metFORMIN (GLUCOPHAGE-XR) 500 MG 24 hr tablet TAKE ONE TABLET BY MOUTH TWICE A DAY BEFORE A MEAL 180 tablet 0   ondansetron (ZOFRAN-ODT) 8 MG disintegrating tablet Take 1 tablet (8 mg total) by mouth every 8 (eight) hours as needed for nausea or vomiting. 24 tablet 0   rosuvastatin (CRESTOR) 20 MG tablet TAKE ONE TABLET BY MOUTH DAILY 90 tablet 1   Tenapanor HCl 50 MG TABS Take 1 tablet by mouth 2 (two) times daily. 60 tablet 3   valsartan-hydrochlorothiazide (DIOVAN-HCT) 320-25 MG tablet TAKE 1 TABLET BY MOUTH DAILY 90 tablet 1   No current facility-administered medications for this visit.     REVIEW OF  SYSTEMS:   Constitutional: Denies fevers, chills or night sweats Eyes: Denies blurriness of vision Ears, nose, mouth, throat, and face: Denies mucositis or sore throat Respiratory: Denies cough, dyspnea or wheezes Cardiovascular: Denies palpitation, chest discomfort or lower extremity swelling Gastrointestinal:  Denies nausea, heartburn or change in bowel habits Skin: Denies abnormal skin rashes Lymphatics: Denies new lymphadenopathy or easy bruising Neurological:Denies numbness, tingling or new weaknesses Behavioral/Psych: Mood is stable, no new changes  All other systems were reviewed with the patient and are negative.  PHYSICAL EXAMINATION: ECOG PERFORMANCE STATUS: 1 - Symptomatic but completely ambulatory  Vitals:   02/16/23 0831  BP: (!) 148/88  Pulse: 81  Resp: 16  Temp: 99.2 F (37.3 C)  SpO2: 98%   Filed Weights   02/16/23 0831  Weight: 242 lb 3.2 oz (109.9 kg)    GENERAL:alert, no distress and comfortable  NEURO: alert & oriented x 3 with fluent speech, no focal motor/sensory deficits  LABORATORY DATA:  I have reviewed the data as listed     Component Value Date/Time   NA 135 10/12/2022 0906   K 4.5 10/12/2022 0906   CL 95 (L) 10/12/2022 0906   CO2 27 10/12/2022 0906   GLUCOSE 258 (H) 10/12/2022 0906   BUN 12 10/12/2022 0906   CREATININE 0.74 10/12/2022 0906   CREATININE 0.80  06/29/2022 0000   CALCIUM 9.7 10/12/2022 0906   PROT 7.6 10/12/2022 0906   ALBUMIN 4.3 10/12/2022 0906   AST 16 10/12/2022 0906   ALT 15 10/12/2022 0906   ALKPHOS 122 (H) 10/12/2022 0906   BILITOT 0.3 10/12/2022 0906    No results found for: "SPEP", "UPEP"  Lab Results  Component Value Date   WBC 10.8 (H) 02/13/2023   NEUTROABS 7.7 02/13/2023   HGB 11.4 (L) 02/13/2023   HCT 35.6 (L) 02/13/2023   MCV 82.6 02/13/2023   PLT 436 (H) 02/13/2023      Chemistry      Component Value Date/Time   NA 135 10/12/2022 0906   K 4.5 10/12/2022 0906   CL 95 (L) 10/12/2022 0906    CO2 27 10/12/2022 0906   BUN 12 10/12/2022 0906   CREATININE 0.74 10/12/2022 0906   CREATININE 0.80 06/29/2022 0000      Component Value Date/Time   CALCIUM 9.7 10/12/2022 0906   ALKPHOS 122 (H) 10/12/2022 0906   AST 16 10/12/2022 0906   ALT 15 10/12/2022 0906   BILITOT 0.3 10/12/2022 0906

## 2023-02-22 ENCOUNTER — Encounter: Payer: Self-pay | Admitting: Medical-Surgical

## 2023-02-22 ENCOUNTER — Ambulatory Visit (INDEPENDENT_AMBULATORY_CARE_PROVIDER_SITE_OTHER): Payer: BC Managed Care – PPO | Admitting: Medical-Surgical

## 2023-02-22 VITALS — BP 126/72 | HR 82 | Resp 20 | Ht 65.0 in | Wt 243.9 lb

## 2023-02-22 DIAGNOSIS — R300 Dysuria: Secondary | ICD-10-CM | POA: Diagnosis not present

## 2023-02-22 DIAGNOSIS — N898 Other specified noninflammatory disorders of vagina: Secondary | ICD-10-CM

## 2023-02-22 DIAGNOSIS — E1165 Type 2 diabetes mellitus with hyperglycemia: Secondary | ICD-10-CM

## 2023-02-22 LAB — POCT URINALYSIS DIP (CLINITEK)
Bilirubin, UA: NEGATIVE
Glucose, UA: 500 mg/dL — AB
Ketones, POC UA: NEGATIVE mg/dL
Leukocytes, UA: NEGATIVE
Nitrite, UA: NEGATIVE
POC PROTEIN,UA: NEGATIVE
Spec Grav, UA: 1.02 (ref 1.010–1.025)
Urobilinogen, UA: 0.2 E.U./dL
pH, UA: 6 (ref 5.0–8.0)

## 2023-02-22 LAB — POCT GLYCOSYLATED HEMOGLOBIN (HGB A1C)
HbA1c, POC (controlled diabetic range): 10.8 % — AB (ref 0.0–7.0)
Hemoglobin A1C: 10.8 % — AB (ref 4.0–5.6)

## 2023-02-22 LAB — WET PREP FOR TRICH, YEAST, CLUE
MICRO NUMBER:: 14959615
Specimen Quality: ADEQUATE

## 2023-02-22 LAB — POCT UA - MICROALBUMIN
Creatinine, POC: 50 mg/dL
Microalbumin Ur, POC: 30 mg/L

## 2023-02-22 MED ORDER — METFORMIN HCL ER 750 MG PO TB24: 750.0000 mg | ORAL_TABLET | Freq: Two times a day (BID) | ORAL | 0 refills | Status: DC

## 2023-02-22 NOTE — Progress Notes (Signed)
        Established patient visit  History, exam, impression, and plan:  1. Uncontrolled type 2 diabetes mellitus with hyperglycemia, without long-term current use of insulin (HCC) Pleasant 47 year old female presenting today to follow-up on diabetes.  Previous hemoglobin A1c in January was 12.5%.  Has been taking metformin 500 mg twice daily, tolerating well without side effects.  Also taking glipizide 10 mg twice daily.  Checking sugars occasionally with fasting noted at 130-160.  Postprandial sugars checked approximately 1.5 hours after eating around 230.  Has been working on dietary modifications with trial and error.  POCT hemoglobin A1c recheck today at 10.8%, still uncontrolled.  Checking microalbumin today.  Has not undergone nutritional education regarding diabetes so making referral today.  Remains hesitant to consider adding an injectable diabetes medication. - POCT HgB A1C - POCT UA - Microalbumin - Amb ref to Medical Nutrition Therapy-MNT  2. Vaginal itching Has been having some intermittent external vaginal itching but no significant discharge.  With her history of uncontrolled sugars, completing wet prep to evaluate for yeast/bacterial vaginosis.  3. Dysuria Reports intermittent burning with urination that usually resolves fairly quickly.  Working to stay well-hydrated.  No other abnormal urinary symptoms.  Running POCT urinalysis today.  Sending for culture if applicable.  Procedures performed this visit: None.  Return in about 3 months (around 05/25/2023) for DM follow up.  __________________________________ Thayer Ohm, DNP, APRN, FNP-BC Primary Care and Sports Medicine [facility]

## 2023-02-23 ENCOUNTER — Inpatient Hospital Stay: Payer: BC Managed Care – PPO

## 2023-02-23 VITALS — BP 139/77 | HR 71 | Temp 98.8°F | Resp 16

## 2023-02-23 DIAGNOSIS — D509 Iron deficiency anemia, unspecified: Secondary | ICD-10-CM | POA: Diagnosis not present

## 2023-02-23 DIAGNOSIS — D500 Iron deficiency anemia secondary to blood loss (chronic): Secondary | ICD-10-CM

## 2023-02-23 LAB — URINE CULTURE: SPECIMEN QUALITY:: ADEQUATE

## 2023-02-23 MED ORDER — SODIUM CHLORIDE 0.9 % IV SOLN
400.0000 mg | Freq: Once | INTRAVENOUS | Status: AC
  Administered 2023-02-23: 400 mg via INTRAVENOUS
  Filled 2023-02-23: qty 20

## 2023-02-23 MED ORDER — SODIUM CHLORIDE 0.9 % IV SOLN: Freq: Once | INTRAVENOUS | Status: AC

## 2023-02-23 NOTE — Progress Notes (Signed)
Pt observed for 30 minutes post venofer infusion. Tolerated well with no adverse symptoms. VSS at time of discharge. Ambulated independently to lobby for discharge.

## 2023-02-23 NOTE — Patient Instructions (Signed)

## 2023-02-24 LAB — URINE CULTURE: MICRO NUMBER:: 14960275

## 2023-03-03 ENCOUNTER — Inpatient Hospital Stay: Payer: BC Managed Care – PPO

## 2023-03-03 VITALS — BP 116/69 | HR 88 | Temp 98.2°F | Resp 16

## 2023-03-03 DIAGNOSIS — D509 Iron deficiency anemia, unspecified: Secondary | ICD-10-CM | POA: Diagnosis not present

## 2023-03-03 DIAGNOSIS — D500 Iron deficiency anemia secondary to blood loss (chronic): Secondary | ICD-10-CM

## 2023-03-03 MED ORDER — SODIUM CHLORIDE 0.9 % IV SOLN
400.0000 mg | Freq: Once | INTRAVENOUS | Status: AC
  Administered 2023-03-03: 400 mg via INTRAVENOUS
  Filled 2023-03-03: qty 20

## 2023-03-03 MED ORDER — SODIUM CHLORIDE 0.9 % IV SOLN: Freq: Once | INTRAVENOUS | Status: AC

## 2023-03-03 NOTE — Patient Instructions (Signed)

## 2023-03-03 NOTE — Progress Notes (Signed)
Pt observed for 30 minutes post Venofer infusion. Pt tolerated trtmt well w/out incident. VSS at discharge.  Ambulatory to lobby.   

## 2023-03-08 ENCOUNTER — Encounter: Payer: Self-pay | Admitting: Hematology and Oncology

## 2023-03-08 ENCOUNTER — Encounter: Payer: Self-pay | Admitting: Medical-Surgical

## 2023-03-08 DIAGNOSIS — N939 Abnormal uterine and vaginal bleeding, unspecified: Secondary | ICD-10-CM

## 2023-03-09 NOTE — Addendum Note (Signed)
Addended byChristen Butter on: 03/09/2023 07:09 AM   Modules accepted: Orders

## 2023-03-10 ENCOUNTER — Inpatient Hospital Stay: Payer: BC Managed Care – PPO

## 2023-03-10 VITALS — BP 149/80 | HR 78 | Temp 98.4°F | Resp 18

## 2023-03-10 DIAGNOSIS — D509 Iron deficiency anemia, unspecified: Secondary | ICD-10-CM | POA: Diagnosis not present

## 2023-03-10 DIAGNOSIS — D500 Iron deficiency anemia secondary to blood loss (chronic): Secondary | ICD-10-CM

## 2023-03-10 MED ORDER — SODIUM CHLORIDE 0.9 % IV SOLN
400.0000 mg | Freq: Once | INTRAVENOUS | Status: AC
  Administered 2023-03-10: 400 mg via INTRAVENOUS
  Filled 2023-03-10: qty 20

## 2023-03-10 MED ORDER — SODIUM CHLORIDE 0.9 % IV SOLN: Freq: Once | INTRAVENOUS | Status: AC

## 2023-03-10 NOTE — Patient Instructions (Signed)

## 2023-03-13 ENCOUNTER — Encounter: Payer: Self-pay | Admitting: Hematology and Oncology

## 2023-03-15 ENCOUNTER — Ambulatory Visit: Payer: BC Managed Care – PPO

## 2023-03-16 ENCOUNTER — Other Ambulatory Visit: Payer: BC Managed Care – PPO

## 2023-03-16 ENCOUNTER — Ambulatory Visit
Admission: RE | Admit: 2023-03-16 | Discharge: 2023-03-16 | Disposition: A | Discharge status: Home / Self Care | Payer: BC Managed Care – PPO | Source: Ambulatory Visit | Attending: Internal Medicine | Admitting: Internal Medicine

## 2023-03-16 DIAGNOSIS — K769 Liver disease, unspecified: Secondary | ICD-10-CM

## 2023-03-18 ENCOUNTER — Other Ambulatory Visit: Payer: Self-pay | Admitting: Internal Medicine

## 2023-03-20 ENCOUNTER — Ambulatory Visit (INDEPENDENT_AMBULATORY_CARE_PROVIDER_SITE_OTHER): Payer: BC Managed Care – PPO

## 2023-03-20 ENCOUNTER — Other Ambulatory Visit: Payer: Self-pay

## 2023-03-20 DIAGNOSIS — K3184 Gastroparesis: Secondary | ICD-10-CM

## 2023-03-20 DIAGNOSIS — N939 Abnormal uterine and vaginal bleeding, unspecified: Secondary | ICD-10-CM

## 2023-03-20 MED ORDER — ESOMEPRAZOLE MAGNESIUM 20 MG PO CPDR
20.0000 mg | DELAYED_RELEASE_CAPSULE | Freq: Every day | ORAL | 1 refills | Status: DC
Start: 2023-03-20 — End: 2023-04-24

## 2023-03-21 ENCOUNTER — Other Ambulatory Visit: Payer: Self-pay | Admitting: Medical-Surgical

## 2023-03-21 DIAGNOSIS — F411 Generalized anxiety disorder: Secondary | ICD-10-CM

## 2023-04-08 ENCOUNTER — Other Ambulatory Visit: Payer: Self-pay | Admitting: Medical-Surgical

## 2023-04-11 MED ORDER — MELOXICAM 15 MG PO TABS
15.0000 mg | ORAL_TABLET | Freq: Every day | ORAL | 0 refills | Status: DC | PRN
  Filled 2023-04-11: qty 30, 30d supply, fill #0

## 2023-04-12 ENCOUNTER — Other Ambulatory Visit (HOSPITAL_COMMUNITY): Payer: Self-pay

## 2023-04-12 ENCOUNTER — Other Ambulatory Visit: Payer: Self-pay

## 2023-04-12 ENCOUNTER — Encounter: Payer: Self-pay | Admitting: Obstetrics and Gynecology

## 2023-04-12 ENCOUNTER — Other Ambulatory Visit (HOSPITAL_COMMUNITY)
Admission: RE | Admit: 2023-04-12 | Discharge: 2023-04-12 | Disposition: A | Discharge status: Home / Self Care | Payer: BC Managed Care – PPO | Source: Ambulatory Visit | Attending: Obstetrics and Gynecology | Admitting: Obstetrics and Gynecology

## 2023-04-12 ENCOUNTER — Ambulatory Visit (INDEPENDENT_AMBULATORY_CARE_PROVIDER_SITE_OTHER): Payer: BC Managed Care – PPO | Admitting: Obstetrics and Gynecology

## 2023-04-12 VITALS — BP 132/82 | HR 90 | Ht 65.0 in | Wt 242.0 lb

## 2023-04-12 DIAGNOSIS — N898 Other specified noninflammatory disorders of vagina: Secondary | ICD-10-CM | POA: Insufficient documentation

## 2023-04-12 DIAGNOSIS — L292 Pruritus vulvae: Secondary | ICD-10-CM | POA: Diagnosis not present

## 2023-04-12 DIAGNOSIS — Z124 Encounter for screening for malignant neoplasm of cervix: Secondary | ICD-10-CM | POA: Insufficient documentation

## 2023-04-12 DIAGNOSIS — R300 Dysuria: Secondary | ICD-10-CM | POA: Diagnosis not present

## 2023-04-12 DIAGNOSIS — N3000 Acute cystitis without hematuria: Secondary | ICD-10-CM

## 2023-04-12 DIAGNOSIS — B3731 Acute candidiasis of vulva and vagina: Secondary | ICD-10-CM | POA: Insufficient documentation

## 2023-04-12 DIAGNOSIS — N939 Abnormal uterine and vaginal bleeding, unspecified: Secondary | ICD-10-CM

## 2023-04-12 DIAGNOSIS — Z3202 Encounter for pregnancy test, result negative: Secondary | ICD-10-CM

## 2023-04-12 LAB — POCT URINALYSIS DIPSTICK
Bilirubin, UA: NEGATIVE
Blood, UA: NEGATIVE
Glucose, UA: POSITIVE — AB
Ketones, UA: NEGATIVE
Leukocytes, UA: NEGATIVE
Nitrite, UA: NEGATIVE
Protein, UA: NEGATIVE
Spec Grav, UA: 1.015 (ref 1.010–1.025)
Urobilinogen, UA: 0.2 E.U./dL
pH, UA: 5 (ref 5.0–8.0)

## 2023-04-12 LAB — POCT URINE PREGNANCY: Preg Test, Ur: NEGATIVE

## 2023-04-12 MED ORDER — FLUCONAZOLE 150 MG PO TABS
150.0000 mg | ORAL_TABLET | ORAL | 3 refills | Status: DC
Start: 2023-04-12 — End: 2024-01-18

## 2023-04-12 MED ORDER — NORETHINDRONE 0.35 MG PO TABS
1.0000 | ORAL_TABLET | Freq: Every day | ORAL | 3 refills | Status: DC
Start: 2023-04-12 — End: 2023-10-23

## 2023-04-12 MED ORDER — NYSTATIN-TRIAMCINOLONE 100000-0.1 UNIT/GM-% EX OINT
1.0000 | TOPICAL_OINTMENT | Freq: Two times a day (BID) | CUTANEOUS | 1 refills | Status: AC | PRN
Start: 2023-04-12 — End: ?

## 2023-04-12 NOTE — Progress Notes (Signed)
GYNECOLOGY OFFICE VISIT NOTE  History:   Lindsey Maxwell is a 47 y.o. G0P0000 here today for AUB. She had some BTB on birth control which she has been on long term. In February she was noted to be anemic and was started on IV iron which caused her to bleed. She was then placed on a new pill by [name] and has been taking it continuously which has helped. She doesn't get anything except spotting on occasion. She does have a known history of PCOS.   She is sexually active without issue.   She also notes hot flashes during the day and at night.    Notes vulvar itching. Has occasional dysuria.      Past Medical History:  Diagnosis Date   Anemia    Anxiety    Bell's palsy    Diabetes mellitus without complication (HCC)    GERD (gastroesophageal reflux disease)    Hypercalcemia    Hypertension    Obesity    PCOS (polycystic ovarian syndrome)     Past Surgical History:  Procedure Laterality Date   UPPER GASTROINTESTINAL ENDOSCOPY      The following portions of the patient's history were reviewed and updated as appropriate: allergies, current medications, past family history, past medical history, past social history, past surgical history and problem list.   Health Maintenance:   Normal pap and negative HRHPV on 2022 with [place] per pt. .    Normal mammogram on 09/2022.   Review of Systems:  Pertinent items noted in HPI and remainder of comprehensive ROS otherwise negative.  Physical Exam:  BP 132/82   Pulse 90   Ht 5\' 5"  (1.651 m)   Wt 242 lb (109.8 kg)   LMP 03/22/2023   BMI 40.27 kg/m  CONSTITUTIONAL: Well-developed, well-nourished female in no acute distress.  HEENT:  Normocephalic, atraumatic. External right and left ear normal. No scleral icterus.  NECK: Normal range of motion, supple, no masses noted on observation SKIN: No rash noted. Not diaphoretic. No erythema. No pallor. MUSCULOSKELETAL: Normal range of motion. No edema noted. NEUROLOGIC: Alert and  oriented to person, place, and time. Normal muscle tone coordination. No cranial nerve deficit noted. PSYCHIATRIC: Normal mood and affect. Normal behavior. Normal judgment and thought content.  CARDIOVASCULAR: Normal heart rate noted RESPIRATORY: Effort and breath sounds normal, no problems with respiration noted ABDOMEN: No masses noted. No other overt distention noted.    PELVIC: Inflammed appearing external genitalia; normal urethral meatus; normal appearing vaginal mucosa and cervix.  No abnormal discharge noted.  Normal uterine size, no other palpable masses, no uterine or adnexal tenderness. Performed in the presence of a chaperone  Labs and Imaging Results for orders placed or performed in visit on 04/12/23 (from the past 168 hour(s))  POCT Urinalysis Dipstick   Collection Time: 04/12/23  3:53 PM  Result Value Ref Range   Color, UA     Clarity, UA     Glucose, UA Positive (A) Negative   Bilirubin, UA neg    Ketones, UA neg    Spec Grav, UA 1.015 1.010 - 1.025   Blood, UA neg    pH, UA 5.0 5.0 - 8.0   Protein, UA Negative Negative   Urobilinogen, UA 0.2 0.2 or 1.0 E.U./dL   Nitrite, UA neg    Leukocytes, UA Negative Negative   Appearance     Odor    POCT urine pregnancy   Collection Time: 04/12/23  4:24 PM  Result Value Ref Range  Preg Test, Ur Negative Negative   US Pelvic Complete With Transvaginal  Result Date: 03/20/2023 CLINICAL DATA:  Bleeding after iron infusion. EXAM: TRANSABDOMINAL AND TRANSVAGINAL ULTRASOUND OF PELVIS TECHNIQUE: Both transabdominal and transvaginal ultrasound examinations of the pelvis were performed. Transabdominal technique was performed for global imaging of the pelvis including uterus, ovaries, adnexal regions, and pelvic cul-de-sac. It was necessary to proceed with endovaginal exam following the transabdominal exam to visualize the adnexal structures. COMPARISON:  MR abdomen 09/13/2022 FINDINGS: Uterus Measurements: 8.1 x 5.0 x 5.7 cm =  volume: 120.1 mL. There is a 1.2 x 1.1 x 1.3 cm intramural fibroid within the uterine fundus. Endometrium Thickness: 8.3 mm.  No focal abnormality visualized. Right ovary Measurements: 4.8 x 2.7 x 3.7 cm = volume: 24.8 mL. There is a 3.2 x 1.8 x 3.0 cm simple cyst within the right ovary. Left ovary Measurements: 4.0 x 2.3 x 2.5 cm = volume: 12.1 mL. Normal appearance/no adnexal mass. Other findings No abnormal free fluid. IMPRESSION: 1. Endometrium measures 8.3 mm. If bleeding remains unresponsive to hormonal or medical therapy, sonohysterogram should be considered for focal lesion work-up. (Ref: Radiological Reasoning: Algorithmic Workup of Abnormal Vaginal Bleeding with Endovaginal Sonography and Sonohysterography. AJR 2008; 308:M57-84) 2. Right ovarian benign functional cyst measuring 3.2 cm. No follow-up imaging is recommended. Reference: Radiology 2019 Nov;293(2):359-371 Electronically Signed   By: Annia Belt M.D.   On: 03/20/2023 22:07        GYNECOLOGY OFFICE PROCEDURE NOTE   Lindsey Maxwell is a 47 y.o. G0P0000 here for endometrial biopsy for AUB. Recent ultrasound also showed 1 cm intramural fibroid and 8 mm lining.  Today, she reports no concerning symptoms.    ENDOMETRIAL BIOPSY     The indications for endometrial biopsy were reviewed.   Risks of the biopsy including cramping, bleeding, infection, uterine perforation, inadequate specimen and need for additional procedures were discussed. Offered alternative of hysteroscopy, dilation and curettage in OR. The patient states she understands the R/B/I/A and agrees to undergo procedure today. Urine pregnancy test was Negative. Consent was signed. Time out was performed.    Patient was positioned in dorsal lithotomy position. A vaginal speculum was placed.  The cervix was visualized and was prepped with Betadine.  A single-toothed tenaculum was placed on the anterior lip of the cervix to stabilize it. The 3 mm pipelle was easily introduced into the  endometrial cavity without difficulty to a depth of 9 cm, and a Scant amount of tissue was obtained after two passes and sent to pathology. The instruments were removed from the patient's vagina. Minimal bleeding from the cervix was noted. The patient tolerated the procedure well.   Assessment and Plan:   Diagnoses and all orders for this visit:  Abnormal uterine bleeding (AUB) Most likely now well suppressed on EE/Norethindrone. Discussed relative contraindications to combined OCPs in her case. Would advise trying progesterone only option. Reviewed risks of BTB and risk of hot flashes worsening due to lack of estrogen. Suspect she is perimenopausal. If hot flashes present, could add back E2 which would work better for symptom control as well and any potential BTB and would be lower potency than EE.  -     Surgical pathology( [facility]/ [facility]) -     POCT urine pregnancy -     norethindrone (MICRONOR) 0.35 MG tablet; Take 1 tablet (0.35 mg total) by mouth daily.  Dysuria -     Urine Culture -     POCT Urinalysis Dipstick  Vaginal itching Suspect yeast infection - diffusely noted on inner labia majora with some thickening c/w lichen simplex chronicus. Will do diflucan and then do topical therapy as well to aid in relief.  -     Cervicovaginal ancillary only( [place]) -     fluconazole (DIFLUCAN) 150 MG tablet; Take 1 tablet (150 mg total) by mouth every 3 (three) days. For three doses -     nystatin-triamcinolone ointment (MYCOLOG); Apply 1 Application topically 2 (two) times daily as needed.  Screening for cervical cancer -     Cytology - PAP    Routine preventative health maintenance measures emphasized. Please refer to After Visit Summary for other counseling recommendations.   Return in about 1 month (around 05/13/2023) for Follow up.  Milas Hock, MD, FACOG Obstetrician & Gynecologist, [facility] for [facility], [facility]

## 2023-04-12 NOTE — Progress Notes (Signed)
Pt states last pap was November 2022- normal results

## 2023-04-14 LAB — CERVICOVAGINAL ANCILLARY ONLY
Bacterial Vaginitis (gardnerella): NEGATIVE
Candida Glabrata: POSITIVE — AB
Candida Vaginitis: POSITIVE — AB
Comment: NEGATIVE
Comment: NEGATIVE
Comment: NEGATIVE

## 2023-04-15 LAB — URINE CULTURE

## 2023-04-17 LAB — SURGICAL PATHOLOGY

## 2023-04-18 MED ORDER — CEFADROXIL 500 MG PO CAPS
500.0000 mg | ORAL_CAPSULE | Freq: Two times a day (BID) | ORAL | 0 refills | Status: DC
Start: 2023-04-18 — End: 2024-02-23

## 2023-04-18 NOTE — Addendum Note (Signed)
Addended by: Milas Hock A on: 04/18/2023 01:05 PM   Modules accepted: Orders

## 2023-04-19 LAB — CYTOLOGY - PAP
Comment: NEGATIVE
Diagnosis: NEGATIVE
Diagnosis: REACTIVE
High risk HPV: NEGATIVE

## 2023-04-24 ENCOUNTER — Encounter: Payer: Self-pay | Admitting: Internal Medicine

## 2023-04-24 ENCOUNTER — Other Ambulatory Visit (INDEPENDENT_AMBULATORY_CARE_PROVIDER_SITE_OTHER): Payer: BC Managed Care – PPO

## 2023-04-24 ENCOUNTER — Encounter: Payer: Self-pay | Admitting: Obstetrics and Gynecology

## 2023-04-24 ENCOUNTER — Ambulatory Visit: Payer: BC Managed Care – PPO | Admitting: Internal Medicine

## 2023-04-24 VITALS — BP 124/72 | HR 78 | Ht 65.0 in | Wt 247.0 lb

## 2023-04-24 DIAGNOSIS — R112 Nausea with vomiting, unspecified: Secondary | ICD-10-CM

## 2023-04-24 DIAGNOSIS — K219 Gastro-esophageal reflux disease without esophagitis: Secondary | ICD-10-CM | POA: Diagnosis not present

## 2023-04-24 DIAGNOSIS — R7989 Other specified abnormal findings of blood chemistry: Secondary | ICD-10-CM

## 2023-04-24 DIAGNOSIS — D509 Iron deficiency anemia, unspecified: Secondary | ICD-10-CM

## 2023-04-24 DIAGNOSIS — K3184 Gastroparesis: Secondary | ICD-10-CM | POA: Diagnosis not present

## 2023-04-24 LAB — CBC
HCT: 38.8 % (ref 36.0–46.0)
Hemoglobin: 12.9 g/dL (ref 12.0–15.0)
MCHC: 33.3 g/dL (ref 30.0–36.0)
MCV: 87.7 fl (ref 78.0–100.0)
Platelets: 352 10*3/uL (ref 150.0–400.0)
RBC: 4.43 Mil/uL (ref 3.87–5.11)
RDW: 18.9 % — ABNORMAL HIGH (ref 11.5–15.5)
WBC: 10 10*3/uL (ref 4.0–10.5)

## 2023-04-24 LAB — PROTIME-INR
INR: 1.1 ratio — ABNORMAL HIGH (ref 0.8–1.0)
Prothrombin Time: 11.5 s (ref 9.6–13.1)

## 2023-04-24 LAB — COMPREHENSIVE METABOLIC PANEL
ALT: 13 U/L (ref 0–35)
AST: 31 U/L (ref 0–37)
Albumin: 4.1 g/dL (ref 3.5–5.2)
Alkaline Phosphatase: 123 U/L — ABNORMAL HIGH (ref 39–117)
BUN: 13 mg/dL (ref 6–23)
CO2: 29 mEq/L (ref 19–32)
Calcium: 9.7 mg/dL (ref 8.4–10.5)
Chloride: 95 mEq/L — ABNORMAL LOW (ref 96–112)
Creatinine, Ser: 0.77 mg/dL (ref 0.40–1.20)
GFR: 93.3 mL/min (ref >60.00)
Glucose, Bld: 236 mg/dL — ABNORMAL HIGH (ref 70–99)
Potassium: 4 mEq/L (ref 3.5–5.1)
Sodium: 135 mEq/L (ref 135–145)
Total Bilirubin: 0.3 mg/dL (ref 0.2–1.2)
Total Protein: 7.6 g/dL (ref 6.0–8.3)

## 2023-04-24 LAB — IBC + FERRITIN
Ferritin: 157.4 ng/mL (ref 10.0–291.0)
Iron: 75 ug/dL (ref 42–145)
Saturation Ratios: 17.5 % — ABNORMAL LOW (ref 20.0–50.0)
TIBC: 428.4 ug/dL (ref 250.0–450.0)
Transferrin: 306 mg/dL (ref 212.0–360.0)

## 2023-04-24 MED ORDER — ESOMEPRAZOLE MAGNESIUM 40 MG PO CPDR: 40.0000 mg | DELAYED_RELEASE_CAPSULE | Freq: Every day | ORAL | 3 refills | Status: DC

## 2023-04-24 MED ORDER — ONDANSETRON 8 MG PO TBDP: 8.0000 mg | ORAL_TABLET | Freq: Three times a day (TID) | ORAL | 3 refills | Status: AC | PRN

## 2023-04-24 NOTE — Progress Notes (Signed)
Chief Complaint: Gastroparesis  HPI : 47 year old female with history of DM, gastroparesis, and PCOS presents for follow up of gastroparesis  Interval History: Last night she did have an acid attack where she vomited. She has a nausea feeling after she eats. Denies chest burning or regurgitation. She takes Nexium 20 mg every day first thing in the morning. She takes Mobic very rarely. Salads and mushrooms are sometimes a trigger for her N&V. Zofran has been helping. Her bowel habits are mostly constipated, occasional she will experience diarrhea. She is not taking anything for constipation. She never took the [place] because she previously had an improvement in her symptoms. Since 02/2023, she had 3 IV iron infusions due to iron deficiency. She is having menstrual periods. She does not think that her periods are excessively heavy. Denies ab pain, blood in stools, or dysphagia.   Wt Readings from Last 3 Encounters:  04/24/23 247 lb (112 kg)  04/12/23 242 lb (109.8 kg)  02/22/23 243 lb 14.4 oz (110.6 kg)    Current Outpatient Medications  Medication Sig Dispense Refill   ALPRAZolam (XANAX) 0.5 MG tablet TAKE 1 TABLET BY MOUTH DAILY AS NEEDED FOR ANXIETY, TO BE USED SPARINGLY FOR SEVERE ANXIETY, NO MORE THAN 2 TO 3 TIMES WEEKLY 15 tablet 0   cefadroxil (DURICEF) 500 MG capsule Take 1 capsule (500 mg total) by mouth 2 (two) times daily. 14 capsule 0   clotrimazole-betamethasone (LOTRISONE) cream Apply 1 application. topically daily. 30 g 0   esomeprazole (NEXIUM) 20 MG capsule Take 1 capsule (20 mg total) by mouth daily. 90 capsule 1   fluconazole (DIFLUCAN) 150 MG tablet Take 1 tablet (150 mg total) by mouth every 3 (three) days. For three doses 3 tablet 3   glipiZIDE (GLUCOTROL) 10 MG tablet TAKE 1 TABLET BY MOUTH 2 TIMES DAILY BEFORE A MEAL 180 tablet 1   [name] 1-20 MG-MCG(24) tablet Take 1 tablet by mouth daily. 84 tablet 4   hydrocortisone (ANUSOL-HC) 2.5 % rectal cream Place 1  Application rectally 2 (two) times daily. 30 g 1   meloxicam (MOBIC) 15 MG tablet Take 1 tablet (15 mg total) by mouth daily as needed for pain. 30 tablet 0   metFORMIN (GLUCOPHAGE-XR) 500 MG 24 hr tablet TAKE ONE TABLET BY MOUTH TWICE A DAY BEFORE A MEAL 180 tablet 0   norethindrone (MICRONOR) 0.35 MG tablet Take 1 tablet (0.35 mg total) by mouth daily. 84 tablet 3   nystatin-triamcinolone ointment (MYCOLOG) Apply 1 Application topically 2 (two) times daily as needed. 30 g 1   ondansetron (ZOFRAN-ODT) 8 MG disintegrating tablet Take 1 tablet (8 mg total) by mouth every 8 (eight) hours as needed for nausea or vomiting. 24 tablet 0   rosuvastatin (CRESTOR) 20 MG tablet TAKE ONE TABLET BY MOUTH DAILY 90 tablet 1   Tenapanor HCl 50 MG TABS Take 1 tablet by mouth 2 (two) times daily. 60 tablet 3   valsartan-hydrochlorothiazide (DIOVAN-HCT) 320-25 MG tablet TAKE 1 TABLET BY MOUTH DAILY 90 tablet 1   No current facility-administered medications for this visit.   Physical Exam: BP 124/72   Pulse 78   Ht 5\' 5"  (1.651 m)   Wt 247 lb (112 kg)   LMP 03/22/2023   BMI 41.10 kg/m  Constitutional: Pleasant,well-developed, female in no acute distress. HEENT: Normocephalic and atraumatic. Conjunctivae are normal. No scleral icterus. Cardiovascular: Normal rate Pulmonary/chest: No increased WOB Abdominal: Soft, nondistended, nontender. Bowel sounds active throughout. There are  no masses palpable. No hepatomegaly. Extremities: No edema Neurological: Alert and oriented to person place and time. Skin: Skin is warm and dry. No rashes noted. Psychiatric: Normal mood and affect. Behavior is normal.  Labs 11/2021: CBC with low Hb of 10.4, MCV 77.4. CMP with elevated alk phos of 130, AST of 47, elevated glucose of 268.  Labs 03/2022: CBC with elevated WBC of 11.7, low Hb of 9.6, and elevated plts of 443. TSH nml. Lipase nml. Ferritin/IBC with low ferritin of 5 and low iron sat of 3.4%.   Labs 06/2022: CMP with  elevated glucose and elevated alk phos of 143.   Labs 07/2022: Hep A Ab NR. Hep B surface antigen NR. Hep B surface antibody NR. IgG nml. ASMA negative. ANA negative. AMA negative. INR nml.   Labs 10/2022: CMP with elevated glucose of 258 and elevated alk phos of 122. Ferritin low at 8.6 and low saturation sat of 9.8%. CBC with low Hb of 10.5 and elevated WBC of 14.9 as well as elevated plts of 527.   Gastric emptying study 09/19/14: IMPRESSION: Gastric retention at 120 min is 36%. Normal retention at 120 min is less than 30%.  KUB 07/08/21: IMPRESSION: Negative.  RUQ U/S 03/25/22: IMPRESSION: 1. Hepatomegaly with abnormal appearing parenchyma suggesting hepatic steatosis and/or other hepatocellular disease. 2. Indeterminate hypoechoic area in the inferior right hepatic lobe which could represent a mass or focal fatty sparing. Consider follow-up MRI abdomen with contrast. 3. 6 mm likely polyp in the gallbladder, no follow-up required.  MR Abdomen w/contrast 04/05/22: IMPRESSION: Multiple small hypervascular hepatic masses, largest measuring 2.6 cm. These are suspicious for small benign hepatic adenomas, with focal nodular hyperplasia also in the differential diagnosis. Recommend follow-up by abdomen MRI in 6 months, without and with Eovist contrast. Mild hepatomegaly and diffuse steatosis. Small hiatal hernia.  MRI liver 09/13/22: IMPRESSION: 1. No significant interval change in the bilobar hepatic lesions measuring up to 2.5 cm, which again are nonspecific but almost certainly benign, with primary differential considerations of adenomas or focal nodular hyperplasia. Suggest follow-up MRI in 6 months with and without EOVIST contrast to ensure stability and potentially more definitive characterization. 2. Hepatomegaly with diffuse hepatic steatosis.  EGD 06/15/22: - Normal esophagus. - Large hiatal hernia. - Six gastric polyps. Resected and retrieved. - Erythematous mucosa in the  antrum. Biopsied. - Normal examined duodenum. Path: 1. Surgical [P], 2nd portion of duodenum - SMALL INTESTINAL MUCOSA WITHIN NORMAL LIMITS. 2. Surgical [P], gastric antrum and gastric body - ANTRAL MUCOSA WITH MILD CHRONIC INACTIVE GASTRITIS. - OXYNTIC MUCOSA WITH NO SIGNIFICANT PATHOLOGY. - NO HELICOBACTER PYLORI ORGANISMS IDENTIFIED ON H&E STAINED SLIDE. 3. Surgical [P], gastric body polyps - FUNDIC GLAND POLYPS 4. Surgical [P], random esophageal sites - SQUAMOUS MUCOSA WITH MILD BASAL CELL HYPERPLASIA, OTHERWISE NO SIGNIFICANT PATHOLOGY  Colonoscopy 06/15/22: - The examined portion of the ileum was normal. - One 7 mm polyp in the transverse colon, removed with a cold snare. Resected and retrieved. - Diverticulosis in the sigmoid colon. - Non-bleeding internal hemorrhoids. - Biopsies were taken with a cold forceps for histology in the entire colon. Path: 5. Surgical [P], colon nos, random sites - COLONIC MUCOSA WITHIN NORMAL LIMITS. 6. Surgical [P], colon, transverse, polyp (1) - SESSILE SERRATED POLYP WITHOUT DYSPLASIA  ASSESSMENT AND PLAN: Gastroparesis GERD N&V Iron deficiency anemia Alternating constipation and diarrhea - improved Elevated LFTs, likely due to fatty liver Hepatic lesions Patient has had some issues with N&V since her last clinic visit.  Did think that her Rybelsus had been contributing to most her symptoms, though it appears that the patient has had a recurrence since then. Thus will increase her Nexium medication and have her try IBSRela to see if constipation may be contributing to her N&V. Will see how she responds to these changes and will also update her labs to see if her IDA has improved with IV iron infusions. - Previously gave information on gastroparesis diet - Encourage weight loss - Check CBC, CMP, PT/INR, ferritin/IBC - Increase Nexium from 20 mg every day to 40 mg every day  - Will take IBSRela to see if this helping with N&V - Refill  Zofran - MRI liver (open MRI) scheduled in 06/2023 for follow up of liver lesions - Repeat colonoscopy for polyp surveillance due in 06/2027 - Previously declined hepatitis A and B vaccines - RTC in 3 months  Eulah Pont, MD  I spent 35 minutes of time, including in depth chart review, independent review of results as outlined above, communicating results with the patient directly, face-to-face time with the patient, coordinating care, ordering studies and medications as appropriate, and documentation.

## 2023-04-24 NOTE — Patient Instructions (Addendum)
Your provider has requested that you go to the basement level for lab work before leaving today. Press "B" on the elevator. The lab is located at the first door on the left as you exit the elevator.   You are scheduled for a follow up visit on 08/07/23 at 8:30 am  Consider taking Ibsrela to see if this will help with nausea and vomiting   We have sent the following medications to your pharmacy for you to pick up at your convenience:Nexium, Zofran  If your blood pressure at your visit was 140/90 or greater, please contact your primary care physician to follow up on this.  _______________________________________________________  If you are age 55 or older, your body mass index should be between 23-30. Your Body mass index is 41.1 kg/m. If this is out of the aforementioned range listed, please consider follow up with your Primary Care Provider.  If you are age 61 or younger, your body mass index should be between 19-25. Your Body mass index is 41.1 kg/m. If this is out of the aformentioned range listed, please consider follow up with your Primary Care Provider.   ________________________________________________________  The [facility] providers would like to encourage you to use [facility] to communicate with providers for non-urgent requests or questions.  Due to long hold times on the telephone, sending your provider a message by [facility] may be a faster and more efficient way to get a response.  Please allow 48 business hours for a response.  Please remember that this is for non-urgent requests.  _______________________________________________________   Due to recent changes in healthcare laws, you may see the results of your imaging and laboratory studies on MyChart before your provider has had a chance to review them.  We understand that in some cases there may be results that are confusing or concerning to you. Not all laboratory results come back in the same time frame and the provider may be  waiting for multiple results in order to interpret others.  Please give [place] 48 hours in order for your provider to thoroughly review all the results before contacting the office for clarification of your results.    Thank you for entrusting me with your care and for choosing [facility],  Dr. Eulah Pont

## 2023-04-25 MED ORDER — ESTRADIOL 0.0375 MG/24HR TD PTWK: 0.0375 mg | MEDICATED_PATCH | TRANSDERMAL | 12 refills | Status: DC

## 2023-05-25 ENCOUNTER — Encounter: Payer: Self-pay | Admitting: Medical-Surgical

## 2023-05-25 ENCOUNTER — Ambulatory Visit (INDEPENDENT_AMBULATORY_CARE_PROVIDER_SITE_OTHER): Payer: BC Managed Care – PPO | Admitting: Medical-Surgical

## 2023-05-25 ENCOUNTER — Ambulatory Visit (INDEPENDENT_AMBULATORY_CARE_PROVIDER_SITE_OTHER): Payer: BC Managed Care – PPO | Admitting: Obstetrics and Gynecology

## 2023-05-25 ENCOUNTER — Encounter: Payer: Self-pay | Admitting: Obstetrics and Gynecology

## 2023-05-25 VITALS — BP 147/85 | HR 91 | Ht 65.0 in | Wt 247.0 lb

## 2023-05-25 VITALS — BP 134/85 | HR 81 | Ht 65.0 in | Wt 247.0 lb

## 2023-05-25 DIAGNOSIS — R232 Flushing: Secondary | ICD-10-CM | POA: Diagnosis not present

## 2023-05-25 DIAGNOSIS — E785 Hyperlipidemia, unspecified: Secondary | ICD-10-CM

## 2023-05-25 DIAGNOSIS — I10 Essential (primary) hypertension: Secondary | ICD-10-CM | POA: Diagnosis not present

## 2023-05-25 DIAGNOSIS — F411 Generalized anxiety disorder: Secondary | ICD-10-CM | POA: Diagnosis not present

## 2023-05-25 DIAGNOSIS — E1165 Type 2 diabetes mellitus with hyperglycemia: Secondary | ICD-10-CM

## 2023-05-25 DIAGNOSIS — E1169 Type 2 diabetes mellitus with other specified complication: Secondary | ICD-10-CM

## 2023-05-25 DIAGNOSIS — N939 Abnormal uterine and vaginal bleeding, unspecified: Secondary | ICD-10-CM | POA: Diagnosis not present

## 2023-05-25 DIAGNOSIS — F321 Major depressive disorder, single episode, moderate: Secondary | ICD-10-CM

## 2023-05-25 DIAGNOSIS — Z8639 Personal history of other endocrine, nutritional and metabolic disease: Secondary | ICD-10-CM

## 2023-05-25 DIAGNOSIS — Z7984 Long term (current) use of oral hypoglycemic drugs: Secondary | ICD-10-CM

## 2023-05-25 DIAGNOSIS — N898 Other specified noninflammatory disorders of vagina: Secondary | ICD-10-CM | POA: Diagnosis not present

## 2023-05-25 LAB — POCT GLYCOSYLATED HEMOGLOBIN (HGB A1C): Hemoglobin A1C: 11.1 % — AB (ref 4.0–5.6)

## 2023-05-25 MED ORDER — ROSUVASTATIN CALCIUM 20 MG PO TABS: ORAL_TABLET | ORAL | 1 refills | Status: DC

## 2023-05-25 MED ORDER — METFORMIN HCL ER 500 MG PO TB24: 500.0000 mg | ORAL_TABLET | Freq: Two times a day (BID) | ORAL | 1 refills | Status: DC

## 2023-05-25 MED ORDER — ESTRADIOL 0.05 MG/24HR TD PTWK: 0.0500 mg | MEDICATED_PATCH | TRANSDERMAL | 12 refills | Status: DC

## 2023-05-25 MED ORDER — VALSARTAN-HYDROCHLOROTHIAZIDE 320-25 MG PO TABS
1.0000 | ORAL_TABLET | Freq: Every day | ORAL | 1 refills | Status: DC
Start: 2023-05-25 — End: 2023-11-21

## 2023-05-25 NOTE — Progress Notes (Signed)
        Established patient visit  History, exam, impression, and plan:  1. Uncontrolled type 2 diabetes mellitus with hyperglycemia, without long-term current use of insulin (HCC) Pleasant 47 year old female presenting today with a history of uncontrolled type 2 diabetes.  She is currently taking glipizide 10 mg twice daily and metformin 500 mg twice daily.  Multiple allergies or intolerances noted to other diabetes medications.  She remains staunchly opposed to injectable options.  Has not been checking her sugars at home.  Recheck of hemoglobin A1c today at 11.1% which is slightly worse than it was 3 months ago.  Candid discussion held regarding the need for transition to injectable medications for diabetes management.  Ultimately, she did not tolerate Rybelsus and is not willing to try Ozempic or Mounjaro.  Discussed adding a nightly insulin such as [place] or Toujeo but she continued to decline.  We did review in detail the risks of uncontrolled diabetes and the multiple health conditions that can result.  She would like to give it 3 more months of dietary modification to see if this will help.  Advised that should her A1c remain uncontrolled at her recheck in 3 months, our options will be exhausted and insulin will be necessary.  Patient verbalized understanding. - POCT HgB A1C  2. Essential hypertension Taking valsartan-HCTZ 320-25 mg daily as prescribed, tolerating well without side effects.  Not monitoring blood pressure at home.  Denies concerning symptoms.  Cardiopulmonary exam reassuring.  She is under quite a bit of stress and is emotional due to recent family losses.  No change in medication at this time. - valsartan-hydrochlorothiazide (DIOVAN-HCT) 320-25 MG tablet; Take 1 tablet by mouth daily.  Dispense: 90 tablet; Refill: 1  3. Hyperlipidemia associated with type 2 diabetes mellitus (HCC) Taking rosuvastatin 20 mg daily, tolerating well without side effects.  Rechecking lipid panel  today. - Lipid panel  4. Anxiety state 5. Depression, major, single episode, moderate (HCC) Has a history of anxiety and depression.  Is not currently on any medications for treatment of this on a daily basis.  Has used Xanax in the past on an as-needed basis and feels this is the only medication that worked for her without side effects.  Has lost several family members in the past couple of months and feels that she may need to make some changes.  Not currently doing any counseling.  Discussed recommendations for a maintenance medication on a daily basis.  Also discussed limiting use of benzodiazepines due to tolerance and dependence.  She will consider options and let me know if she would like to try something.  Of note, feel that a very low dose of Lexapro would be a good option as I do not feel she has tried this before.  6. History of iron deficiency Rechecking iron panel today. - Iron, TIBC and Ferritin Panel   Procedures performed this visit: None.  Return in about 3 months (around 08/25/2023) for DM/HTN/HLD follow up.  __________________________________ Thayer Ohm, DNP, APRN, FNP-BC Primary Care and Sports Medicine [facility] [place]

## 2023-05-25 NOTE — Progress Notes (Signed)
GYNECOLOGY OFFICE VISIT NOTE  History:   Lindsey Maxwell is a 47 y.o. G0P0000 here today for follow up for:  Vulvar itching - she was yeast positive and was treated with diflucan and mycolog. She reports it has resolved.  AUB and hot flashes - switched from EE/NE combined to OCP to NE alone. She then developed hot flashes. I started her on an E2 patch at 0.0375mg . Since then she reports an improvement in her hot flashes. Still sometimes having them that soak through things. Her bleeding has continued to be stopped.   The following portions of the patient's history were reviewed and updated as appropriate: allergies, current medications, past family history, past medical history, past social history, past surgical history and problem list.   Health Maintenance:   Normal pap and negative HRHPV: Diagnosis  Date Value Ref Range Status  04/12/2023   Final   - Negative for Intraepithelial Lesions or Malignancy (NILM)  04/12/2023 - Benign reactive/reparative changes  Final     Normal mammogram on 09/19/2022.   Review of Systems:  Pertinent items noted in HPI and remainder of comprehensive ROS otherwise negative.  Physical Exam:  BP 134/85   Pulse 81   Ht 5\' 5"  (1.651 m)   Wt 247 lb (112 kg)   BMI 41.10 kg/m  CONSTITUTIONAL: Well-developed, well-nourished female in no acute distress.  HEENT:  Normocephalic, atraumatic. External right and left ear normal. No scleral icterus.  NECK: Normal range of motion, supple, no masses noted on observation SKIN: No rash noted. Not diaphoretic. No erythema. No pallor. MUSCULOSKELETAL: Normal range of motion. No edema noted. NEUROLOGIC: Alert and oriented to person, place, and time. Normal muscle tone coordination. No cranial nerve deficit noted. PSYCHIATRIC: Normal mood and affect. Normal behavior. Normal judgment and thought content.  PELVIC: Deferred  Labs and Imaging Results for orders placed or performed in visit on 05/25/23 (from the past 168  hour(s))  POCT HgB A1C   Collection Time: 05/25/23  8:33 AM  Result Value Ref Range   Hemoglobin A1C 11.1 (A) 4.0 - 5.6 %   HbA1c POC (<> result, manual entry)     HbA1c, POC (prediabetic range)     HbA1c, POC (controlled diabetic range)     Assessment and Plan:  Lindsey Maxwell was seen today for follow-up.  Diagnoses and all orders for this visit:  Hot flashes Discussed HRT and patch being safest in combination with oral progesterone tablet (currently taking NE). She has some adhesive reaction but thus far tolerating it well since she can move the patch around. Discussed lowest dose to control symptoms is the goal and to  get her off it once through menopause. We discussed given still some severe hot flashes can try next dose up. She agrees with plan.  -     estradiol (CLIMARA) 0.05 mg/24hr patch; Place 1 patch (0.05 mg total) onto the skin once a week.  Vaginal itching Resolved. Reviewed as T2DM is under better control this will make yeast infections less likely. Discussed if recurs, can use mycolog and then call us for diflucan refill.   Abnormal uterine bleeding (AUB) Resolved. Continue NE.     Meds ordered this encounter  Medications   estradiol (CLIMARA) 0.05 mg/24hr patch    Sig: Place 1 patch (0.05 mg total) onto the skin once a week.    Dispense:  4 patch    Refill:  12     Routine preventative health maintenance measures emphasized. Please refer to After  Visit Summary for other counseling recommendations.   No follow-ups on file. - Will follow up prn and in July for annual.   Milas Hock, MD, FACOG Obstetrician & Gynecologist, [facility] for [employer], [facility]

## 2023-05-26 LAB — IRON,TIBC AND FERRITIN PANEL
Ferritin: 255 ng/mL — ABNORMAL HIGH (ref 15–150)
Iron Saturation: 17 % (ref 15–55)
Iron: 58 ug/dL (ref 27–159)
Total Iron Binding Capacity: 351 ug/dL (ref 250–450)
UIBC: 293 ug/dL (ref 131–425)

## 2023-05-26 LAB — LIPID PANEL
Chol/HDL Ratio: 7.1 ratio — ABNORMAL HIGH (ref 0.0–4.4)
Cholesterol, Total: 200 mg/dL — ABNORMAL HIGH (ref 100–199)
HDL: 28 mg/dL — ABNORMAL LOW (ref >39)
LDL Chol Calc (NIH): 116 mg/dL — ABNORMAL HIGH (ref 0–99)
Triglycerides: 320 mg/dL — ABNORMAL HIGH (ref 0–149)
VLDL Cholesterol Cal: 56 mg/dL — ABNORMAL HIGH (ref 5–40)

## 2023-06-02 ENCOUNTER — Encounter: Payer: Self-pay | Admitting: Hematology and Oncology

## 2023-06-02 ENCOUNTER — Encounter: Payer: Self-pay | Admitting: Obstetrics and Gynecology

## 2023-06-02 DIAGNOSIS — R232 Flushing: Secondary | ICD-10-CM

## 2023-06-06 MED ORDER — ESTRADIOL 0.05 MG/24HR TD PTTW
1.0000 | MEDICATED_PATCH | TRANSDERMAL | 12 refills | Status: DC
Start: 2023-06-08 — End: 2023-06-13

## 2023-06-09 ENCOUNTER — Encounter: Payer: Self-pay | Admitting: Internal Medicine

## 2023-06-13 ENCOUNTER — Ambulatory Visit
Admission: RE | Admit: 2023-06-13 | Discharge: 2023-06-13 | Disposition: A | Discharge status: Home / Self Care | Payer: BC Managed Care – PPO | Source: Ambulatory Visit | Attending: Internal Medicine | Admitting: Internal Medicine

## 2023-06-13 MED ORDER — GADOPICLENOL 0.5 MMOL/ML IV SOLN
10.0000 mL | Freq: Once | INTRAVENOUS | Status: AC | PRN
  Administered 2023-06-13: 10 mL via INTRAVENOUS

## 2023-06-13 MED ORDER — ESTRADIOL 0.0375 MG/24HR TD PTTW
1.0000 | MEDICATED_PATCH | TRANSDERMAL | 12 refills | Status: DC
Start: 2023-06-15 — End: 2023-10-23

## 2023-06-13 NOTE — Addendum Note (Signed)
Addended by: Milas Hock A on: 06/13/2023 10:09 AM   Modules accepted: Orders

## 2023-06-16 ENCOUNTER — Other Ambulatory Visit: Payer: BC Managed Care – PPO

## 2023-06-19 MED ORDER — NORETHINDRONE-ETH ESTRADIOL 0.5-2.5 MG-MCG PO TABS
1.0000 | ORAL_TABLET | Freq: Every day | ORAL | 1 refills | Status: DC
Start: 2023-06-19 — End: 2023-08-10

## 2023-06-19 NOTE — Addendum Note (Signed)
Addended by: Milas Hock A on: 06/19/2023 08:15 AM   Modules accepted: Orders

## 2023-06-21 ENCOUNTER — Other Ambulatory Visit: Payer: Self-pay | Admitting: Medical-Surgical

## 2023-06-21 DIAGNOSIS — E1165 Type 2 diabetes mellitus with hyperglycemia: Secondary | ICD-10-CM

## 2023-06-21 MED ORDER — GLIPIZIDE 10 MG PO TABS
10.0000 mg | ORAL_TABLET | Freq: Every day | ORAL | 1 refills | Status: DC
Start: 2023-06-21 — End: 2023-06-21

## 2023-06-21 MED ORDER — GLIPIZIDE 10 MG PO TABS: 10.0000 mg | ORAL_TABLET | Freq: Two times a day (BID) | ORAL | 1 refills | Status: DC

## 2023-06-30 IMAGING — US US ABDOMEN LIMITED
1 series · 15 of 25 positions shown · non-contrast
Comparison: None Available.

CLINICAL DATA: Anemia, nausea, vomiting

EXAM:
ULTRASOUND ABDOMEN LIMITED RIGHT UPPER QUADRANT

[Series 1: us abdomen limited ruq mc & wl · 15 of 58 slices shown]
[im 1/58]
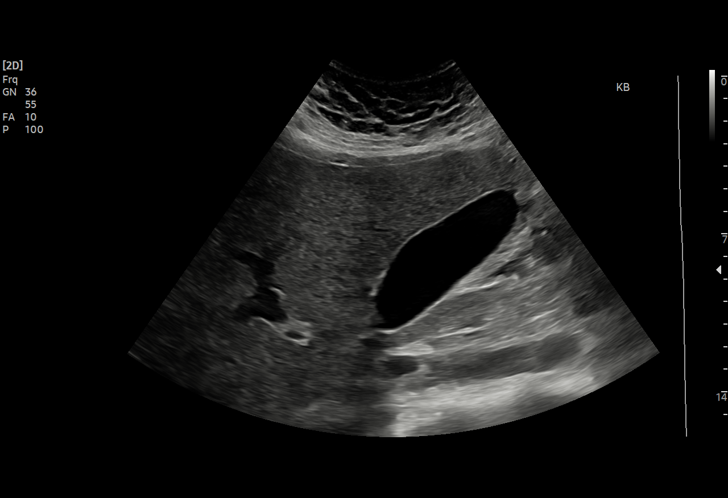
[im 5/58]
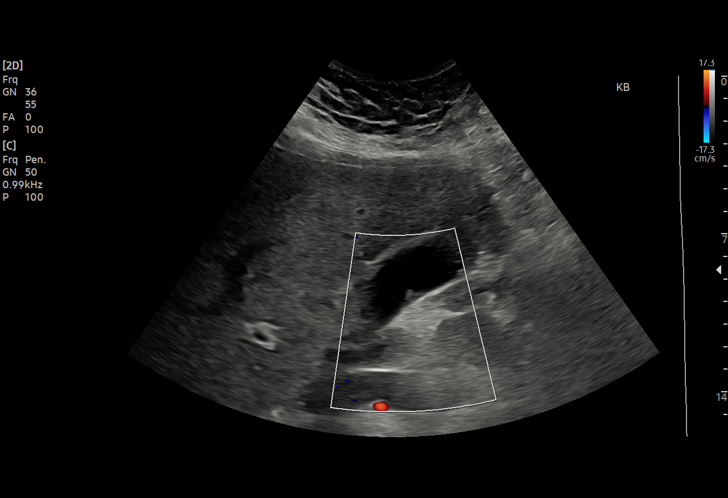
[im 10/58]
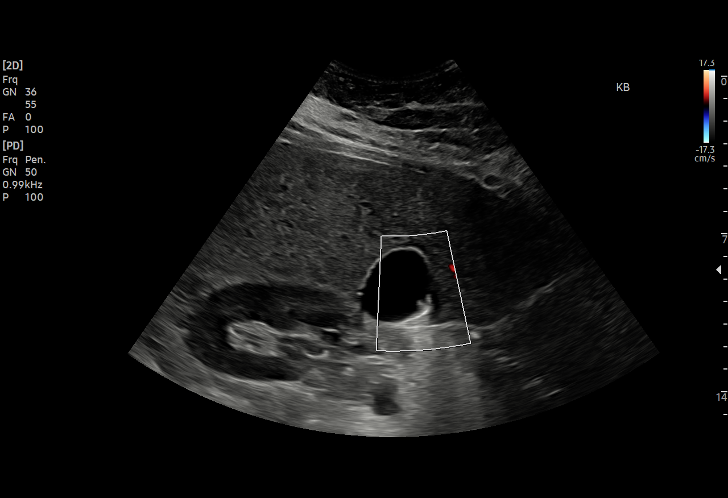
[im 12/58]
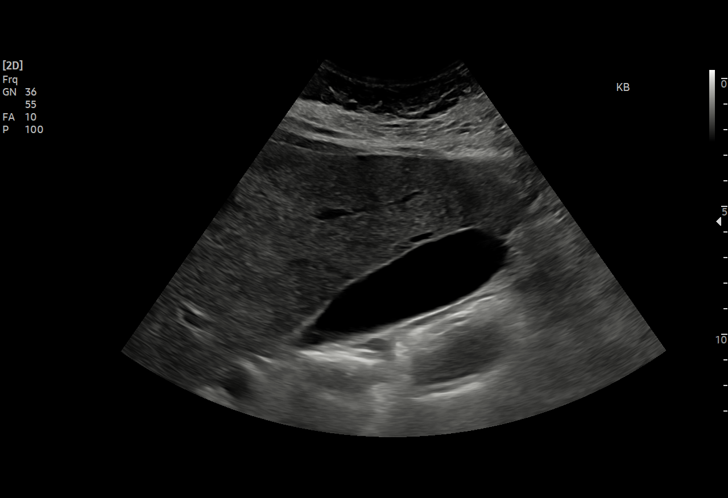
[im 17/58]
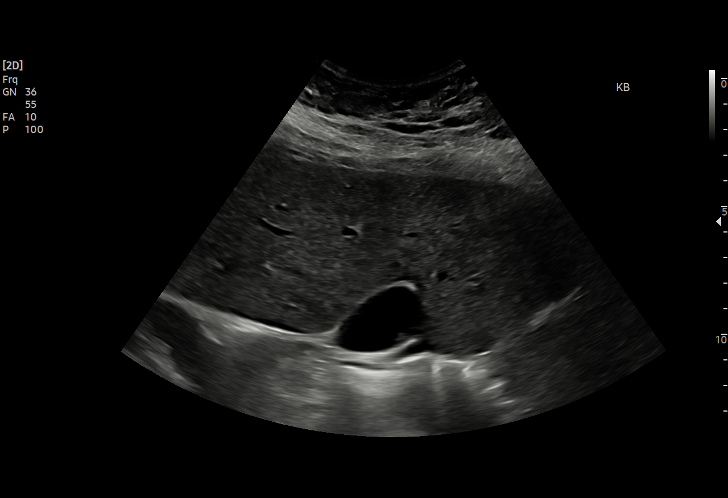
[im 22/58]
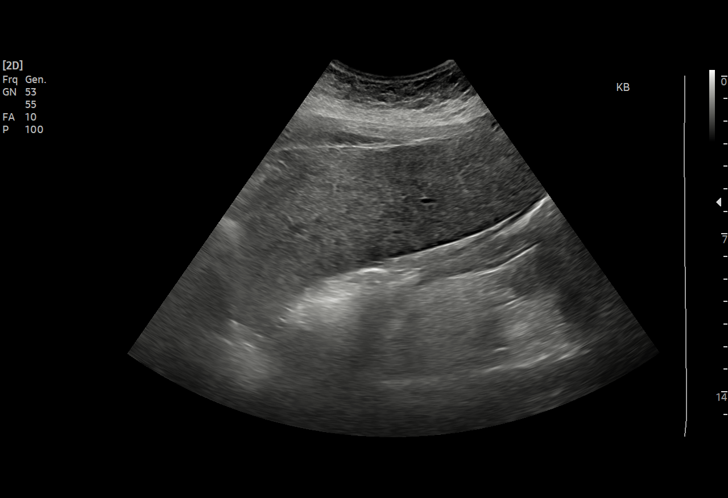
[im 24/58]
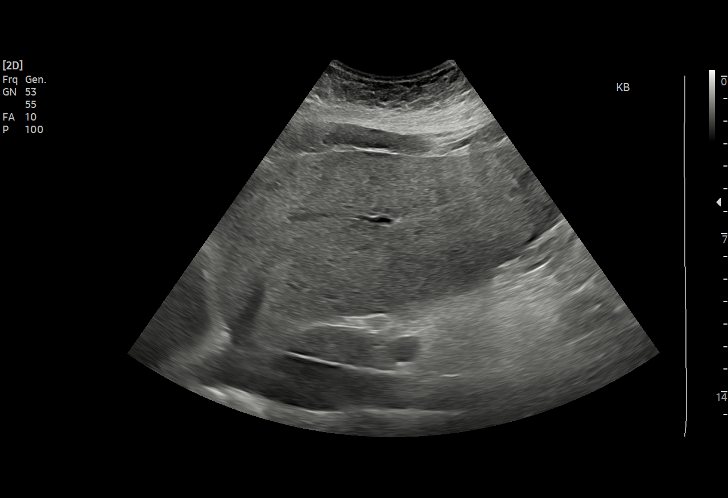
[im 29/58]
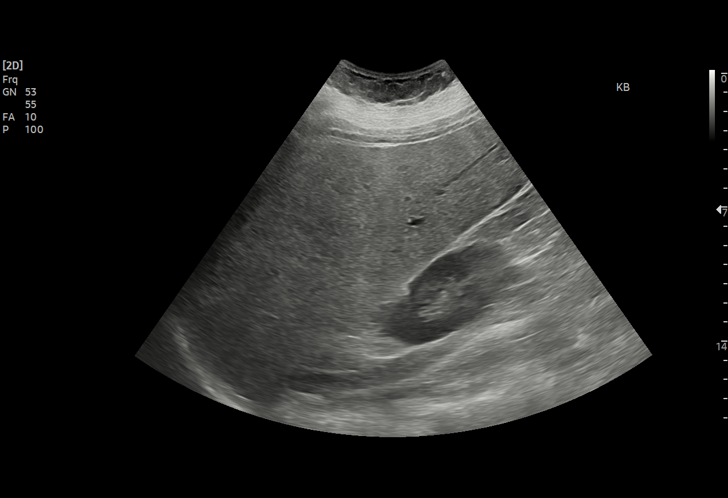
[im 34/58]
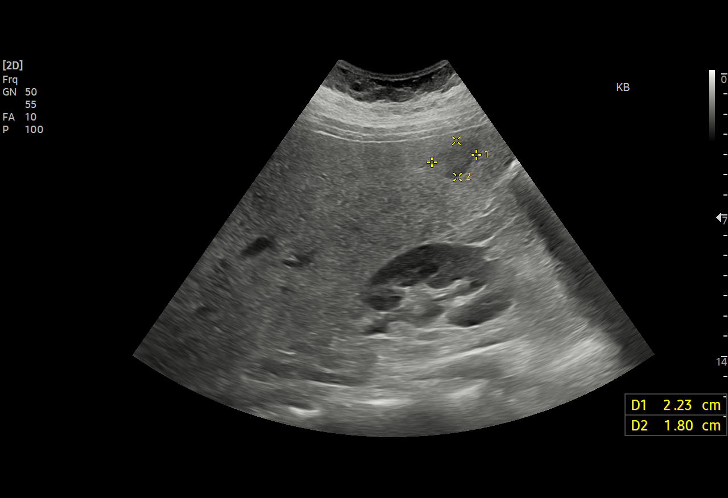
[im 36/58]
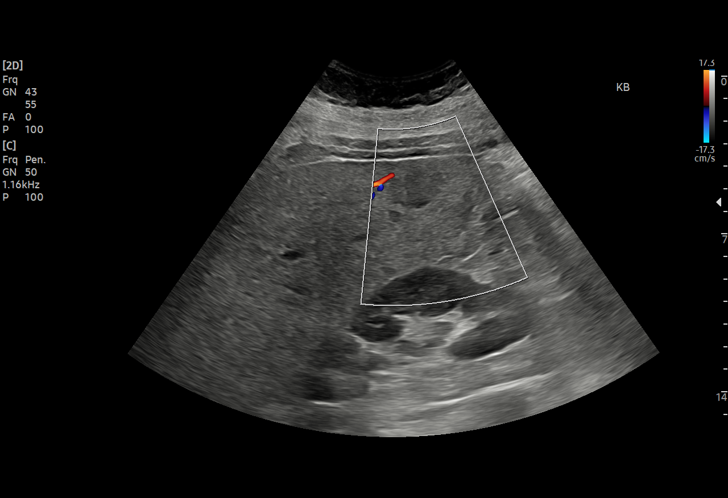
[im 41/58]
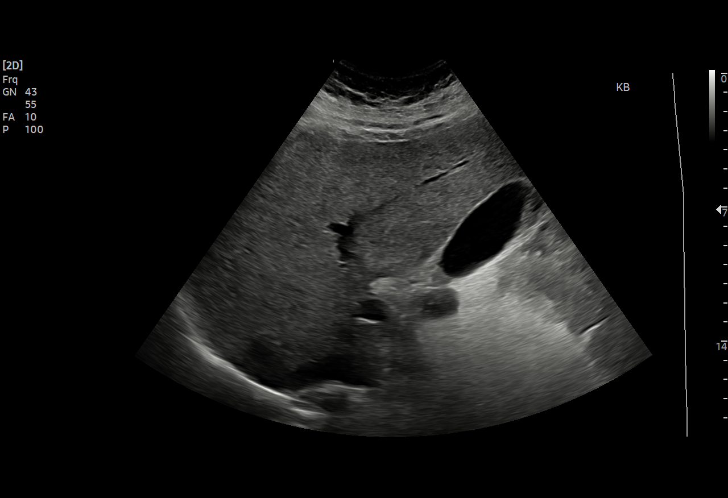
[im 46/58]
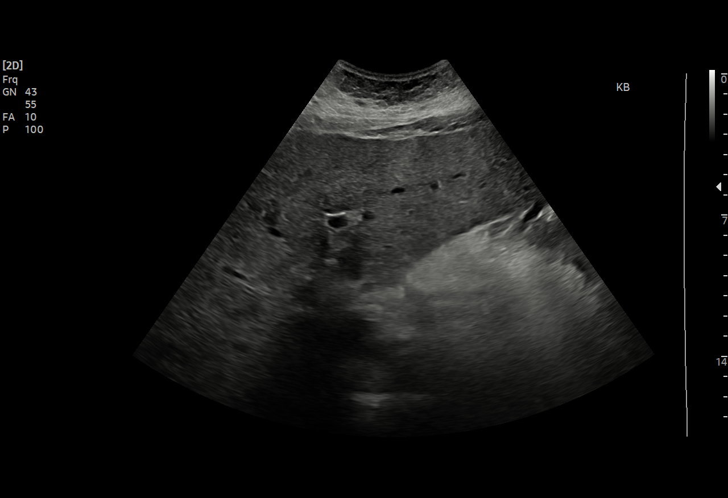
[im 48/58]
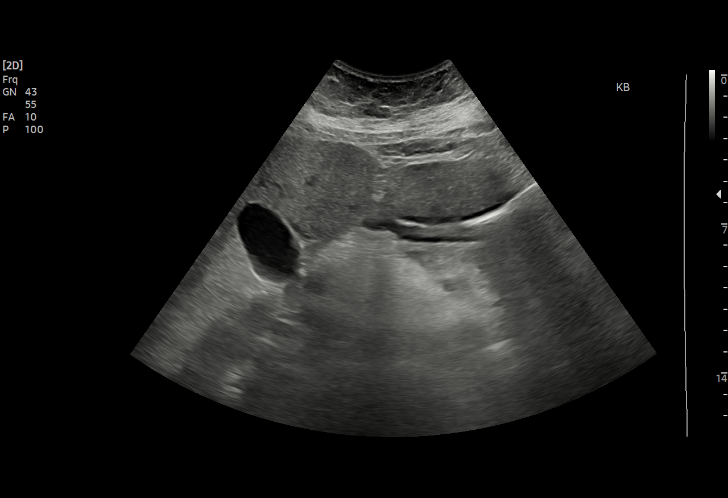
[im 53/58]
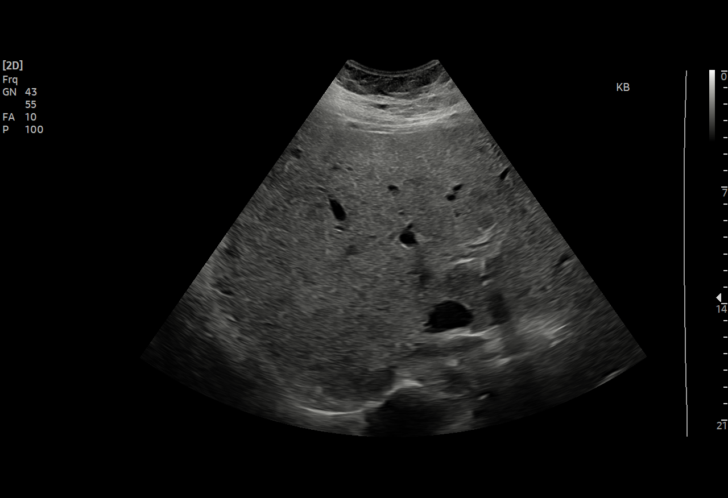
[im 58/58]
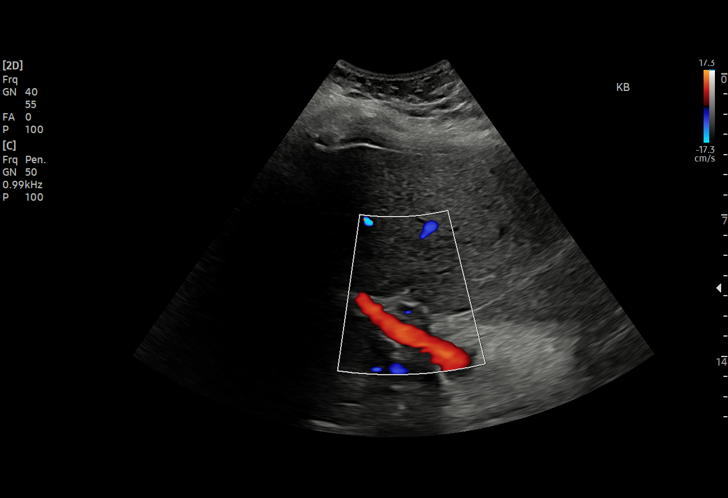

[15 of 25 positions shown; findings below may reference images not displayed]

FINDINGS: Gallbladder:

No gallstones or wall thickening visualized. 6 mm echogenic likely
polyp. No sonographic Murphy sign noted by sonographer.

Common bile duct:

Diameter: 3 mm

Liver:

Enlarged measuring approximately 21.7 cm in length. Coarse,
increased echogenicity of the parenchyma. Indeterminate ill-defined
mildly hypoechoic focus in the inferior right hepatic lobe measuring
2.2 x 2.4 x 1.8 cm. Portal vein is patent on color Doppler imaging
with normal direction of blood flow towards the liver.

Other: None.
IMPRESSION: 1. Hepatomegaly with abnormal appearing parenchyma suggesting
hepatic steatosis and/or other hepatocellular disease.
2. Indeterminate hypoechoic area in the inferior right hepatic lobe
which could represent a mass or focal fatty sparing. Consider
follow-up MRI abdomen with contrast.
3. 6 mm likely polyp in the gallbladder, no follow-up required.

## 2023-08-07 ENCOUNTER — Encounter: Payer: Self-pay | Admitting: Internal Medicine

## 2023-08-07 ENCOUNTER — Ambulatory Visit (INDEPENDENT_AMBULATORY_CARE_PROVIDER_SITE_OTHER): Payer: BC Managed Care – PPO | Admitting: Internal Medicine

## 2023-08-07 ENCOUNTER — Other Ambulatory Visit (INDEPENDENT_AMBULATORY_CARE_PROVIDER_SITE_OTHER): Payer: BC Managed Care – PPO

## 2023-08-07 VITALS — BP 126/80 | HR 90 | Ht 65.0 in | Wt 243.0 lb

## 2023-08-07 DIAGNOSIS — K769 Liver disease, unspecified: Secondary | ICD-10-CM

## 2023-08-07 DIAGNOSIS — R7989 Other specified abnormal findings of blood chemistry: Secondary | ICD-10-CM

## 2023-08-07 DIAGNOSIS — D509 Iron deficiency anemia, unspecified: Secondary | ICD-10-CM

## 2023-08-07 DIAGNOSIS — K3184 Gastroparesis: Secondary | ICD-10-CM

## 2023-08-07 DIAGNOSIS — R198 Other specified symptoms and signs involving the digestive system and abdomen: Secondary | ICD-10-CM | POA: Diagnosis not present

## 2023-08-07 DIAGNOSIS — K219 Gastro-esophageal reflux disease without esophagitis: Secondary | ICD-10-CM

## 2023-08-07 LAB — IBC + FERRITIN
Ferritin: 77.7 ng/mL (ref 10.0–291.0)
Iron: 77 ug/dL (ref 42–145)
Saturation Ratios: 19.3 % — ABNORMAL LOW (ref 20.0–50.0)
TIBC: 399 ug/dL (ref 250.0–450.0)
Transferrin: 285 mg/dL (ref 212.0–360.0)

## 2023-08-07 LAB — CBC
HCT: 41 % (ref 36.0–46.0)
Hemoglobin: 13.7 g/dL (ref 12.0–15.0)
MCHC: 33.4 g/dL (ref 30.0–36.0)
MCV: 91.3 fL (ref 78.0–100.0)
Platelets: 406 10*3/uL — ABNORMAL HIGH (ref 150.0–400.0)
RBC: 4.5 Mil/uL (ref 3.87–5.11)
RDW: 14.1 % (ref 11.5–15.5)
WBC: 12.1 10*3/uL — ABNORMAL HIGH (ref 4.0–10.5)

## 2023-08-07 MED ORDER — ESOMEPRAZOLE MAGNESIUM 40 MG PO CPDR: 40.0000 mg | DELAYED_RELEASE_CAPSULE | Freq: Every day | ORAL | 10 refills | Status: DC

## 2023-08-07 NOTE — Progress Notes (Signed)
Chief Complaint: Gastroparesis  HPI : 47 year old female with history of DM, gastroparesis, and PCOS presents for follow up of gastroparesis  Interval History: She has been trying to get her hormones optimized with her birth control. She found out that she is allergic to estrogen patches after she was tried on the patch. Her hot flashes are better controlled on her new hormone pills. Her nausea and vomiting has improved. She is still taking her Nexium 40 mg every day to keep her acid reflux under control. She is using Zofran once a month on average. Constipation has been okay for the most part. Sometimes the constipation will flare up once a month. On average she is has one BM per day. If she is having more constipation, then it will one BM once every few days, followed by 4-5 BMs when she is having diarrhea. She never started her IBSRela. She is eating a little bit of salad. For the most part she is following the gastroparesis diet. She just had a little bit of blood in her stools one time over the last 3 months when she was more constipated. Denies recent menstrual periods. She is still working on regulating her blood sugars.  Wt Readings from Last 3 Encounters:  08/07/23 243 lb (110.2 kg)  05/25/23 247 lb (112 kg)  05/25/23 247 lb 0.6 oz (112.1 kg)    Current Outpatient Medications  Medication Sig Dispense Refill   ALPRAZolam (XANAX) 0.5 MG tablet TAKE 1 TABLET BY MOUTH DAILY AS NEEDED FOR ANXIETY, TO BE USED SPARINGLY FOR SEVERE ANXIETY, NO MORE THAN 2 TO 3 TIMES WEEKLY 15 tablet 0   cefadroxil (DURICEF) 500 MG capsule Take 1 capsule (500 mg total) by mouth 2 (two) times daily. 14 capsule 0   clotrimazole-betamethasone (LOTRISONE) cream Apply 1 application. topically daily. 30 g 0   esomeprazole (NEXIUM) 40 MG capsule Take 1 capsule (40 mg total) by mouth daily at 12 noon. 30 capsule 3   estradiol (VIVELLE-DOT) 0.0375 MG/24HR Place 1 patch onto the skin 2 (two) times a week. 8 patch 12    fluconazole (DIFLUCAN) 150 MG tablet Take 1 tablet (150 mg total) by mouth every 3 (three) days. For three doses 3 tablet 3   glipiZIDE (GLUCOTROL) 10 MG tablet Take 1 tablet (10 mg total) by mouth 2 (two) times daily before a meal. 180 tablet 1   hydrocortisone (ANUSOL-HC) 2.5 % rectal cream Place 1 Application rectally 2 (two) times daily. 30 g 1   meloxicam (MOBIC) 15 MG tablet Take 1 tablet (15 mg total) by mouth daily as needed for pain. 30 tablet 0   metFORMIN (GLUCOPHAGE-XR) 500 MG 24 hr tablet Take 1 tablet (500 mg total) by mouth 2 (two) times daily with a meal. 180 tablet 1   norethindrone (MICRONOR) 0.35 MG tablet Take 1 tablet (0.35 mg total) by mouth daily. (Patient not taking: Reported on 08/07/2023) 84 tablet 3   norethindrone-ethinyl estradiol (FEMHRT LOW DOSE) 0.5-2.5 MG-MCG tablet Take 1 tablet by mouth daily. 30 tablet 1   nystatin-triamcinolone ointment (MYCOLOG) Apply 1 Application topically 2 (two) times daily as needed. 30 g 1   ondansetron (ZOFRAN-ODT) 8 MG disintegrating tablet Take 1 tablet (8 mg total) by mouth every 8 (eight) hours as needed for nausea or vomiting. 30 tablet 3   rosuvastatin (CRESTOR) 20 MG tablet TAKE ONE TABLET BY MOUTH DAILY 90 tablet 1   Tenapanor HCl 50 MG TABS Take 1 tablet by mouth 2 (two)  times daily. 60 tablet 3   valsartan-hydrochlorothiazide (DIOVAN-HCT) 320-25 MG tablet Take 1 tablet by mouth daily. 90 tablet 1   No current facility-administered medications for this visit.   Physical Exam: BP 126/80   Pulse 90   Ht 5\' 5"  (1.651 m)   Wt 243 lb (110.2 kg)   BMI 40.44 kg/m  Constitutional: Pleasant,well-developed, female in no acute distress. HEENT: Normocephalic and atraumatic. Conjunctivae are normal. No scleral icterus. Cardiovascular: Normal rate Pulmonary/chest: No increased WOB Abdominal: Soft, nondistended, nontender. Bowel sounds active throughout. There are no masses palpable. No hepatomegaly. Extremities: No  edema Neurological: Alert and oriented to person place and time. Skin: Skin is warm and dry. No rashes noted. Psychiatric: Normal mood and affect. Behavior is normal.  Labs 11/2021: CBC with low Hb of 10.4, MCV 77.4. CMP with elevated alk phos of 130, AST of 47, elevated glucose of 268.  Labs 03/2022: CBC with elevated WBC of 11.7, low Hb of 9.6, and elevated plts of 443. TSH nml. Lipase nml. Ferritin/IBC with low ferritin of 5 and low iron sat of 3.4%.   Labs 06/2022: CMP with elevated glucose and elevated alk phos of 143.   Labs 07/2022: Hep A Ab NR. Hep B surface antigen NR. Hep B surface antibody NR. IgG nml. ASMA negative. ANA negative. AMA negative. INR nml.   Labs 10/2022: CMP with elevated glucose of 258 and elevated alk phos of 122. Ferritin low at 8.6 and low saturation sat of 9.8%. CBC with low Hb of 10.5 and elevated WBC of 14.9 as well as elevated plts of 527.   Labs 04/2023: CBC with nml Hb. CMP with elevated glucose of 236 and elevated alk phos of 123. INR mildly elevated at 1.1. Iron studies with mildly low iron sat of 17.5%.  Labs 05/2023: Iron studies normal. HbA1C 11.1%  Gastric emptying study 09/19/14: IMPRESSION: Gastric retention at 120 min is 36%. Normal retention at 120 min is less than 30%.  KUB 07/08/21: IMPRESSION: Negative.  RUQ U/S 03/25/22: IMPRESSION: 1. Hepatomegaly with abnormal appearing parenchyma suggesting hepatic steatosis and/or other hepatocellular disease. 2. Indeterminate hypoechoic area in the inferior right hepatic lobe which could represent a mass or focal fatty sparing. Consider follow-up MRI abdomen with contrast. 3. 6 mm likely polyp in the gallbladder, no follow-up required.  MR Abdomen w/contrast 04/05/22: IMPRESSION: Multiple small hypervascular hepatic masses, largest measuring 2.6 cm. These are suspicious for small benign hepatic adenomas, with focal nodular hyperplasia also in the differential diagnosis. Recommend follow-up by abdomen  MRI in 6 months, without and with Eovist contrast. Mild hepatomegaly and diffuse steatosis. Small hiatal hernia.  MRI liver 09/13/22: IMPRESSION: 1. No significant interval change in the bilobar hepatic lesions measuring up to 2.5 cm, which again are nonspecific but almost certainly benign, with primary differential considerations of adenomas or focal nodular hyperplasia. Suggest follow-up MRI in 6 months with and without EOVIST contrast to ensure stability and potentially more definitive characterization. 2. Hepatomegaly with diffuse hepatic steatosis.  MRI liver w/contrast 06/13/23: IMPRESSION: 1. Assessment of arterially hyperenhancing liver lesions is somewhat limited due to artifact related to body habitus and breath motion, however these are not significantly changed, notably in the inferior right lobe of the liver, hepatic segment V measuring 2.3 x 2.1 cm, and in hepatic segment IVB measuring 0.9 cm. Persistent hyperenhancement on remaining contrast phases. These remain most consistent with focal nodular hyperplasia or perhaps alternately hepatic adenoma; established stability to this point highly reassuring for a benign  etiology. As previously recommended, if further follow-up is elected, would recommend the use of Eovist contrast to more confidently distinguish between these two entities. Please specify Eovist contrast when ordering to ensure its use. 2. Hepatomegaly and hepatic steatosis. 3. Small hiatal hernia.  EGD 06/15/22: - Normal esophagus. - Large hiatal hernia. - Six gastric polyps. Resected and retrieved. - Erythematous mucosa in the antrum. Biopsied. - Normal examined duodenum. Path: 1. Surgical [P], 2nd portion of duodenum - SMALL INTESTINAL MUCOSA WITHIN NORMAL LIMITS. 2. Surgical [P], gastric antrum and gastric body - ANTRAL MUCOSA WITH MILD CHRONIC INACTIVE GASTRITIS. - OXYNTIC MUCOSA WITH NO SIGNIFICANT PATHOLOGY. - NO HELICOBACTER PYLORI ORGANISMS  IDENTIFIED ON H&E STAINED SLIDE. 3. Surgical [P], gastric body polyps - FUNDIC GLAND POLYPS 4. Surgical [P], random esophageal sites - SQUAMOUS MUCOSA WITH MILD BASAL CELL HYPERPLASIA, OTHERWISE NO SIGNIFICANT PATHOLOGY  Colonoscopy 06/15/22: - The examined portion of the ileum was normal. - One 7 mm polyp in the transverse colon, removed with a cold snare. Resected and retrieved. - Diverticulosis in the sigmoid colon. - Non-bleeding internal hemorrhoids. - Biopsies were taken with a cold forceps for histology in the entire colon. Path: 5. Surgical [P], colon nos, random sites - COLONIC MUCOSA WITHIN NORMAL LIMITS. 6. Surgical [P], colon, transverse, polyp (1) - SESSILE SERRATED POLYP WITHOUT DYSPLASIA  ASSESSMENT AND PLAN: Gastroparesis GERD Hiatal hernia N&V Iron deficiency anemia Alternating constipation and diarrhea - improved Elevated LFTs, likely due to fatty liver Hepatic lesions Patient's nausea and vomiting appears to be better controlled currently.  Her GERD has been better on a higher dose of Nexium daily.  She is following the gastroparesis diet for the most part.  Recently she did have her hormone therapy adjusted with some improvement in her hot flashes, which may also be helping with her nausea and vomiting.  Patient on her last set of labs showed that her iron deficiency and anemia had both resolved with iron supplementation.  Patient does still have occasional episodes of constipation once a month.  I told her that would be okay to use IBSRela as needed when these constipation episodes occur since her residual nausea and vomiting may be related to her constipation episodes.  Patient's liver lesions on her last MRI still appear to been benign but we will plan for an MRI with Eovist contrast in 1 year for further evaluation.  Patient is still attempting to regulate her blood sugars and lose weight over time.  - Previously gave information on gastroparesis diet - Encourage  weight loss - Check CBC, ferritin/IBC - Continue Nexium 40 mg every day. Refilled - Use IBSRela PRN when she gets constipated - Cont Zofran PRN - MRI liver with Eovist contrast (open MRI) scheduled in 06/2024 for follow up of liver lesions - Repeat colonoscopy for polyp surveillance due in 06/2027 - Patient still declines hepatitis A and B vaccines and doesn't think that she would be interested in them in the future - RTC in 6 months  Eulah Pont, MD  I spent 38 minutes of time, including in depth chart review, independent review of results as outlined above, communicating results with the patient directly, face-to-face time with the patient, coordinating care, ordering studies and medications as appropriate, and documentation.

## 2023-08-07 NOTE — Patient Instructions (Addendum)
We have sent the following medications to your pharmacy for you to pick up at your convenience: Nexium  Use IBSRela as needed   Follow up in 6 months  If your blood pressure at your visit was 140/90 or greater, please contact your primary care physician to follow up on this.  _______________________________________________________  If you are age 45 or older, your body mass index should be between 23-30. Your Body mass index is 40.44 kg/m. If this is out of the aforementioned range listed, please consider follow up with your Primary Care Provider.  If you are age 79 or younger, your body mass index should be between 19-25. Your Body mass index is 40.44 kg/m. If this is out of the aformentioned range listed, please consider follow up with your Primary Care Provider.   ________________________________________________________  The [place] [facility] providers would like to encourage you to use [facility] to communicate with providers for non-urgent requests or questions.  Due to long hold times on the telephone, sending your provider a message by [facility] may be a faster and more efficient way to get a response.  Please allow 48 business hours for a response.  Please remember that this is for non-urgent requests.  _______________________________________________________   Thank you for entrusting me with your care and for choosing [facility], Dr. Eulah Pont

## 2023-08-10 ENCOUNTER — Encounter: Payer: Self-pay | Admitting: Obstetrics and Gynecology

## 2023-08-10 ENCOUNTER — Other Ambulatory Visit: Payer: Self-pay | Admitting: Obstetrics and Gynecology

## 2023-08-10 DIAGNOSIS — R232 Flushing: Secondary | ICD-10-CM

## 2023-08-13 ENCOUNTER — Other Ambulatory Visit: Payer: Self-pay | Admitting: Obstetrics and Gynecology

## 2023-08-13 DIAGNOSIS — N3000 Acute cystitis without hematuria: Secondary | ICD-10-CM

## 2023-08-25 ENCOUNTER — Encounter: Payer: BC Managed Care – PPO | Admitting: Medical-Surgical

## 2023-08-28 ENCOUNTER — Ambulatory Visit: Payer: BC Managed Care – PPO | Admitting: Medical-Surgical

## 2023-09-20 ENCOUNTER — Encounter: Payer: Self-pay | Admitting: Obstetrics and Gynecology

## 2023-10-18 ENCOUNTER — Other Ambulatory Visit: Payer: Self-pay | Admitting: Internal Medicine

## 2023-10-23 ENCOUNTER — Other Ambulatory Visit: Payer: Self-pay | Admitting: Obstetrics and Gynecology

## 2023-10-23 ENCOUNTER — Encounter: Payer: Self-pay | Admitting: Obstetrics and Gynecology

## 2023-10-25 MED ORDER — NORETHIN ACE-ETH ESTRAD-FE 1.5-30 MG-MCG PO TABS: 1.0000 | ORAL_TABLET | Freq: Every day | ORAL | 3 refills | Status: DC

## 2023-11-03 ENCOUNTER — Telehealth: Payer: Self-pay | Admitting: Pharmacy Technician

## 2023-11-03 NOTE — Telephone Encounter (Signed)
Pharmacy Patient Advocate Encounter   Received notification from CoverMyMeds that prior authorization for [facility] is required/requested.   Insurance verification completed.   The patient is insured through [employer] .   Per test claim: PA required; PA submitted to above mentioned insurance via CoverMyMeds Key/confirmation #/EOC [facility] Status is pending

## 2023-11-05 ENCOUNTER — Other Ambulatory Visit (HOSPITAL_COMMUNITY): Payer: Self-pay

## 2023-11-05 NOTE — Telephone Encounter (Signed)
Pharmacy Patient Advocate Encounter  Received notification from [facility] that Prior Authorization for [facility] 50MG  has been APPROVED from 1.24.25 to 1.24.26. Unable to obtain price due to refill too soon rejection, last fill date 1.9.25 next available fill date2.4.25   PA #/Case ID/Reference #: [id number]

## 2023-11-10 ENCOUNTER — Encounter: Payer: Self-pay | Admitting: Internal Medicine

## 2023-11-15 ENCOUNTER — Other Ambulatory Visit: Payer: Self-pay | Admitting: *Deleted

## 2023-11-15 MED ORDER — NORETHIN ACE-ETH ESTRAD-FE 1.5-30 MG-MCG PO TABS: ORAL_TABLET | ORAL | 4 refills | Status: DC

## 2023-11-18 ENCOUNTER — Other Ambulatory Visit: Payer: Self-pay | Admitting: Medical-Surgical

## 2023-11-18 DIAGNOSIS — I10 Essential (primary) hypertension: Secondary | ICD-10-CM

## 2023-11-20 ENCOUNTER — Other Ambulatory Visit: Payer: Self-pay | Admitting: Medical-Surgical

## 2023-11-20 DIAGNOSIS — I10 Essential (primary) hypertension: Secondary | ICD-10-CM

## 2023-12-05 MED ORDER — NORGESTIMATE-ETH ESTRADIOL 0.25-35 MG-MCG PO TABS: 1.0000 | ORAL_TABLET | Freq: Every day | ORAL | 3 refills | Status: DC

## 2023-12-05 NOTE — Addendum Note (Signed)
 Addended by: Milas Hock A on: 12/05/2023 09:13 AM   Modules accepted: Orders

## 2023-12-14 ENCOUNTER — Other Ambulatory Visit: Payer: Self-pay | Admitting: Medical-Surgical

## 2023-12-14 DIAGNOSIS — I10 Essential (primary) hypertension: Secondary | ICD-10-CM

## 2023-12-14 DIAGNOSIS — E1165 Type 2 diabetes mellitus with hyperglycemia: Secondary | ICD-10-CM

## 2024-01-07 ENCOUNTER — Other Ambulatory Visit: Payer: Self-pay | Admitting: Medical-Surgical

## 2024-01-07 DIAGNOSIS — I10 Essential (primary) hypertension: Secondary | ICD-10-CM

## 2024-01-07 DIAGNOSIS — E1165 Type 2 diabetes mellitus with hyperglycemia: Secondary | ICD-10-CM

## 2024-01-11 ENCOUNTER — Other Ambulatory Visit: Payer: Self-pay | Admitting: Obstetrics and Gynecology

## 2024-01-11 DIAGNOSIS — N898 Other specified noninflammatory disorders of vagina: Secondary | ICD-10-CM

## 2024-01-16 ENCOUNTER — Encounter: Payer: Self-pay | Admitting: Obstetrics and Gynecology

## 2024-01-18 ENCOUNTER — Other Ambulatory Visit: Payer: Self-pay

## 2024-01-18 ENCOUNTER — Other Ambulatory Visit: Payer: Self-pay | Admitting: Obstetrics and Gynecology

## 2024-01-18 DIAGNOSIS — N898 Other specified noninflammatory disorders of vagina: Secondary | ICD-10-CM

## 2024-01-18 MED ORDER — FLUCONAZOLE 150 MG PO TABS
150.0000 mg | ORAL_TABLET | ORAL | 3 refills | Status: DC
Start: 2024-01-18 — End: 2024-02-23

## 2024-02-21 ENCOUNTER — Ambulatory Visit: Admitting: Obstetrics and Gynecology

## 2024-02-21 ENCOUNTER — Encounter: Payer: Self-pay | Admitting: Obstetrics and Gynecology

## 2024-02-21 VITALS — BP 136/83 | HR 97 | Ht 65.0 in | Wt 235.0 lb

## 2024-02-21 DIAGNOSIS — N951 Menopausal and female climacteric states: Secondary | ICD-10-CM | POA: Diagnosis not present

## 2024-02-21 DIAGNOSIS — N921 Excessive and frequent menstruation with irregular cycle: Secondary | ICD-10-CM | POA: Diagnosis not present

## 2024-02-21 MED ORDER — ESTRADIOL 0.5 MG PO TABS: 0.5000 mg | ORAL_TABLET | Freq: Every day | ORAL | 3 refills | Status: DC

## 2024-02-21 MED ORDER — NORETHINDRONE 0.35 MG PO TABS
1.0000 | ORAL_TABLET | Freq: Every day | ORAL | 3 refills | Status: DC
Start: 2024-02-21 — End: 2024-02-23

## 2024-02-21 NOTE — Progress Notes (Signed)
 GYNECOLOGY OFFICE VISIT NOTE  History:    Lindsey Maxwell is a 48 y.o. 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   Back story is off birth control she has very heavy periods such that she had to get IV iron . She was placed on OCPs by Cherre Cornish last year and this helped. She had hot flashes so she was switched to an HRT type regimen. She had a good response to Climara  patch and NE 0.35 mg but then had a skin reaction to the patch. She then switched back to OCPs but then has had BTB on them since. She was on Junel 1.5/30 and then more recently on Ortho Cyclen.   Her log of bleeding is: Dec 27-Feb1 Feb 16-Mar 5 Mar 20-Apr 18 Apri 23-May 3  The amount of bleeding is variable with some days being very light spotting that requires only a pantiliner.   She has also been going through a lot - her uncle who cares for two aunts recently had an MI. She had to take her mother to the hospital again.    The following portions of the patient's history were reviewed and updated as appropriate: allergies, current medications, past family history, past medical history, past social history, past surgical history and problem list.   Health Maintenance:   Normal pap and negative HRHPV: Diagnosis  Date Value Ref Range Status  04/12/2023   Final   - Negative for Intraepithelial Lesions or Malignancy (NILM)  04/12/2023 - Benign reactive/reparative changes  Final     Normal mammogram on 12/20203.   Review of Systems:  Pertinent items noted in HPI and remainder of comprehensive ROS otherwise negative.  Physical Exam:  BP 136/83   Pulse 97   Ht 5\' 5"  (1.651 m)   Wt 235 lb (106.6 kg)   LMP 01/31/2024   BMI 39.11 kg/m  CONSTITUTIONAL: Well-developed, well-nourished female in no acute distress.  HEENT:  Normocephalic, atraumatic. External right and left ear normal. No scleral icterus.  NECK: Normal range of motion, supple, no masses noted on observation SKIN: No rash noted. Not  diaphoretic. No erythema. No pallor. MUSCULOSKELETAL: Normal range of motion. No edema noted. NEUROLOGIC: Alert and oriented to person, place, and time. Normal muscle tone coordination. No cranial nerve deficit noted. PSYCHIATRIC: Normal mood and affect. Normal behavior. Normal judgment and thought content.  PELVIC: Deferred  Labs and Imaging No results found for this or any previous visit (from the past week). No results found.  Assessment and Plan:   1. Breakthrough bleeding on birth control pills (Primary) Discussed current BTB is combination of being perimenopause, her having heavy periods off birth control and just the known side effect of birth control/hormonal therapy.  We discussed options available.  HRT only regimen like Jinteli. However this won't help with bleeding patterns.  E2 oral with NE - will help bleeding and hot flashes.  E2 combined with IUD - she is nervous about this due to a friend's experience that was quite negative (needed surgery, scar tissue, etc).  Endometrial ablation Hysterectomy After discussing options, she would like to do E2 0.5mg /NE. If this doesn't work, then E2 1mg /NE. If this doesn't work, then maybe triphasic.   2. Perimenopause Hot flashes - see above    Meds ordered this encounter  Medications   estradiol  (ESTRACE ) 0.5 MG tablet    Sig: Take 1 tablet (0.5 mg total) by mouth daily.    Dispense:  30 tablet    Refill:  3   norethindrone  (MICRONOR ) 0.35 MG tablet    Sig: Take 1 tablet (0.35 mg total) by mouth daily.    Dispense:  84 tablet    Refill:  3     Routine preventative health maintenance measures emphasized. Please refer to After Visit Summary for other counseling recommendations.   No follow-ups on file.  Lacey Pian, MD, FACOG Obstetrician & Gynecologist, [facility] for [facility], [facility]

## 2024-02-22 ENCOUNTER — Encounter: Payer: Self-pay | Admitting: Obstetrics and Gynecology

## 2024-02-23 ENCOUNTER — Telehealth: Payer: Self-pay

## 2024-02-23 ENCOUNTER — Other Ambulatory Visit (HOSPITAL_COMMUNITY): Payer: Self-pay

## 2024-02-23 ENCOUNTER — Encounter: Payer: Self-pay | Admitting: Medical-Surgical

## 2024-02-23 ENCOUNTER — Ambulatory Visit (INDEPENDENT_AMBULATORY_CARE_PROVIDER_SITE_OTHER): Admitting: Medical-Surgical

## 2024-02-23 VITALS — BP 133/77 | HR 93 | Resp 20 | Ht 65.0 in | Wt 235.0 lb

## 2024-02-23 DIAGNOSIS — I10 Essential (primary) hypertension: Secondary | ICD-10-CM

## 2024-02-23 DIAGNOSIS — Z8639 Personal history of other endocrine, nutritional and metabolic disease: Secondary | ICD-10-CM

## 2024-02-23 DIAGNOSIS — E1165 Type 2 diabetes mellitus with hyperglycemia: Secondary | ICD-10-CM

## 2024-02-23 DIAGNOSIS — E1169 Type 2 diabetes mellitus with other specified complication: Secondary | ICD-10-CM

## 2024-02-23 DIAGNOSIS — E785 Hyperlipidemia, unspecified: Secondary | ICD-10-CM

## 2024-02-23 DIAGNOSIS — Z7984 Long term (current) use of oral hypoglycemic drugs: Secondary | ICD-10-CM

## 2024-02-23 LAB — POCT UA - MICROALBUMIN
Creatinine, POC: 300 mg/dL
Microalbumin Ur, POC: 80 mg/L

## 2024-02-23 LAB — POCT GLYCOSYLATED HEMOGLOBIN (HGB A1C)
HbA1c, POC (controlled diabetic range): 9.9 % — AB (ref 0.0–7.0)
Hemoglobin A1C: 9.9 % — AB (ref 4.0–5.6)

## 2024-02-23 MED ORDER — PIOGLITAZONE HCL 15 MG PO TABS: 15.0000 mg | ORAL_TABLET | Freq: Every day | ORAL | 3 refills | Status: DC

## 2024-02-23 MED ORDER — ROSUVASTATIN CALCIUM 20 MG PO TABS: ORAL_TABLET | ORAL | 1 refills | Status: DC

## 2024-02-23 MED ORDER — GLIPIZIDE ER 10 MG PO TB24
20.0000 mg | ORAL_TABLET | Freq: Every day | ORAL | 1 refills | Status: DC
Start: 2024-02-23 — End: 2024-08-26

## 2024-02-23 MED ORDER — VALSARTAN-HYDROCHLOROTHIAZIDE 320-25 MG PO TABS: 1.0000 | ORAL_TABLET | Freq: Every day | ORAL | 1 refills | Status: DC

## 2024-02-23 NOTE — Telephone Encounter (Signed)
 Pharmacy Patient Advocate Encounter  Received notification from [facility] that Prior Authorization for Pioglitazone HCl 15MG  tablets has been APPROVED from 02/23/24 to 02/22/25. Ran test claim, Copay is $0. This test claim was processed through [facility]- copay amounts may vary at other pharmacies due to pharmacy/plan contracts, or as the patient moves through the different stages of their insurance plan.   PA #/Case ID/Reference #: [id number]

## 2024-02-23 NOTE — Telephone Encounter (Signed)
 Pharmacy Patient Advocate Encounter   Received notification from [employer] that prior authorization for Pioglitazone HCl 15MG  tablets is required/requested.   Insurance verification completed.   The patient is insured through [employer] .   Per test claim: PA required; PA submitted to above mentioned insurance via CoverMyMeds Key/confirmation #/EOC [id number] Status is pending

## 2024-02-23 NOTE — Progress Notes (Signed)
        Established patient visit  History, exam, impression, and plan:  1. Essential hypertension (Primary) Pleasant 48 year old female presenting today with history of hypertension currently treated with valsartan -hydrochlorothiazide  320-25 mg daily.  She is compliant with her medication, no side effects.  Not checking blood pressures at home.  Is working on her diet and aiming for weight loss.  Has increased her activity and is exercising whenever possible. Denies CP, SOB, palpitations, lower extremity edema, dizziness, headaches, or vision changes.  Cardiopulmonary exam normal today.  Checking labs as below.  Continue valsartan -HCTZ as prescribed. - valsartan -hydrochlorothiazide  (DIOVAN -HCT) 320-25 MG tablet; Take 1 tablet by mouth daily.  Dispense: 90 tablet; Refill: 1 - CBC with Differential/Platelet - CMP14+EGFR - Lipid panel  2. Uncontrolled type 2 diabetes mellitus with hyperglycemia, without long-term current use of insulin (HCC) Currently taking glipizide  10 mg twice daily although admits that she often forgets the evening dose.  Not checking sugars at home and is not open to the idea of having to poke her finger.  Also does not tolerate the adhesive on the continuous glucose monitors.  She was prescribed metformin  but has not been taking it due to GI upset in the setting of IBS.  List of allergies/intolerances reviewed and leaves us  very little option for adding medications.  She is staunchly against any kind of injectable diabetic medication.  We have discussed this in the past and feel she would be a good candidate for Lantus or another long-acting insulin.  We did discuss this in depth today and the expectations for side effects.  She still is not interested in starting that.  On review of her medications, the only agent that she does not show an allergy/intolerance to and has not been tried is Actos.  Reviewed that it is not recommended to pare glipizide  with Actos due to the risk of  hypoglycemia but she is willing to risk this.  Hemoglobin A1c at last check was 11.1%.  Today recheck shows 9.9%.  Foot exam completed with no neuropathy.  Microalbumin now abnormal showing stressed the kidneys.  I did caution her that uncontrolled diabetes can cause permanent kidney damage and we need to get her sugars under control to prevent this.  Patient verbalized understanding and is agreeable to the plan.  We will touch base again in 3 months to recheck her A1c and see how things are going.  Recommend updating eye exam. - POCT HgB A1C - POCT UA - Microalbumin - HM Diabetes Foot Exam - CMP14+EGFR  3. Hyperlipidemia associated with type 2 diabetes mellitus (HCC) Taking Crestor  20 mg daily, tolerating well without side effects.  As noted above, working on exercise and dietary changes.  Checking labs today.  Continue Crestor  as prescribed. - rosuvastatin  (CRESTOR ) 20 MG tablet; TAKE ONE TABLET BY MOUTH DAILY  Dispense: 90 tablet; Refill: 1 - POCT HgB A1C - POCT UA - Microalbumin - HM Diabetes Foot Exam - CMP14+EGFR - Lipid panel  4. History of iron  deficiency Long history of iron  deficiency related to excessive blood loss with menstruation.  She is now on birth control and is being followed by OB/GYN.  She would like to have her iron  checked today so placing orders. - Iron , TIBC and Ferritin Panel   Procedures performed this visit: None.  Return in about 3 months (around 05/25/2024) for DM/HTN follow up.  __________________________________ Lindsey Snook, DNP, APRN, FNP-BC Primary Care and Sports Medicine [facility] [place]

## 2024-02-24 ENCOUNTER — Ambulatory Visit: Payer: Self-pay | Admitting: Medical-Surgical

## 2024-02-24 LAB — CMP14+EGFR
ALT: 15 IU/L (ref 0–32)
AST: 29 IU/L (ref 0–40)
Albumin: 4.3 g/dL (ref 3.9–4.9)
Alkaline Phosphatase: 143 IU/L — ABNORMAL HIGH (ref 44–121)
BUN/Creatinine Ratio: 17 (ref 9–23)
BUN: 14 mg/dL (ref 6–24)
Bilirubin Total: 0.2 mg/dL (ref 0.0–1.2)
CO2: 20 mmol/L (ref 20–29)
Calcium: 9.7 mg/dL (ref 8.7–10.2)
Chloride: 94 mmol/L — ABNORMAL LOW (ref 96–106)
Creatinine, Ser: 0.81 mg/dL (ref 0.57–1.00)
Globulin, Total: 3.3 g/dL (ref 1.5–4.5)
Glucose: 212 mg/dL — ABNORMAL HIGH (ref 70–99)
Potassium: 4 mmol/L (ref 3.5–5.2)
Sodium: 136 mmol/L (ref 134–144)
Total Protein: 7.6 g/dL (ref 6.0–8.5)
eGFR: 91 mL/min/{1.73_m2} (ref >59)

## 2024-02-24 LAB — CBC WITH DIFFERENTIAL/PLATELET
Basophils Absolute: 0.1 10*3/uL (ref 0.0–0.2)
Basos: 1 %
EOS (ABSOLUTE): 0.3 10*3/uL (ref 0.0–0.4)
Eos: 2 %
Hematocrit: 38.1 % (ref 34.0–46.6)
Hemoglobin: 12.6 g/dL (ref 11.1–15.9)
Immature Grans (Abs): 0 10*3/uL (ref 0.0–0.1)
Immature Granulocytes: 0 %
Lymphocytes Absolute: 2.4 10*3/uL (ref 0.7–3.1)
Lymphs: 20 %
MCH: 29.3 pg (ref 26.6–33.0)
MCHC: 33.1 g/dL (ref 31.5–35.7)
MCV: 89 fL (ref 79–97)
Monocytes Absolute: 0.4 10*3/uL (ref 0.1–0.9)
Monocytes: 4 %
Neutrophils Absolute: 8.6 10*3/uL — ABNORMAL HIGH (ref 1.4–7.0)
Neutrophils: 73 %
Platelets: 424 10*3/uL (ref 150–450)
RBC: 4.3 x10E6/uL (ref 3.77–5.28)
RDW: 12.3 % (ref 11.7–15.4)
WBC: 11.8 10*3/uL — ABNORMAL HIGH (ref 3.4–10.8)

## 2024-02-24 LAB — IRON,TIBC AND FERRITIN PANEL
Ferritin: 196 ng/mL — ABNORMAL HIGH (ref 15–150)
Iron Saturation: 23 % (ref 15–55)
Iron: 90 ug/dL (ref 27–159)
Total Iron Binding Capacity: 399 ug/dL (ref 250–450)
UIBC: 309 ug/dL (ref 131–425)

## 2024-02-24 LAB — LIPID PANEL
Chol/HDL Ratio: 7 ratio — ABNORMAL HIGH (ref 0.0–4.4)
Cholesterol, Total: 188 mg/dL (ref 100–199)
HDL: 27 mg/dL — ABNORMAL LOW (ref >39)
LDL Chol Calc (NIH): 99 mg/dL (ref 0–99)
Triglycerides: 366 mg/dL — ABNORMAL HIGH (ref 0–149)
VLDL Cholesterol Cal: 62 mg/dL — ABNORMAL HIGH (ref 5–40)

## 2024-03-07 ENCOUNTER — Other Ambulatory Visit: Payer: Self-pay | Admitting: Medical-Surgical

## 2024-03-07 DIAGNOSIS — F411 Generalized anxiety disorder: Secondary | ICD-10-CM

## 2024-03-07 NOTE — Telephone Encounter (Signed)
 Last filled 03/22/2023  Last office visit 02/23/2024  Next appointment 05/24/2024

## 2024-03-11 ENCOUNTER — Other Ambulatory Visit: Payer: Self-pay | Admitting: Medical-Surgical

## 2024-03-12 ENCOUNTER — Other Ambulatory Visit (HOSPITAL_COMMUNITY): Payer: Self-pay

## 2024-03-12 MED ORDER — MELOXICAM 15 MG PO TABS
15.0000 mg | ORAL_TABLET | Freq: Every day | ORAL | 0 refills | Status: AC | PRN
  Filled 2024-03-12: qty 30, 30d supply, fill #0

## 2024-05-13 ENCOUNTER — Other Ambulatory Visit: Payer: Self-pay | Admitting: Internal Medicine

## 2024-05-24 ENCOUNTER — Ambulatory Visit: Admitting: Medical-Surgical

## 2024-06-16 ENCOUNTER — Other Ambulatory Visit: Payer: Self-pay | Admitting: Obstetrics and Gynecology

## 2024-06-16 DIAGNOSIS — N921 Excessive and frequent menstruation with irregular cycle: Secondary | ICD-10-CM

## 2024-06-20 ENCOUNTER — Other Ambulatory Visit: Payer: Self-pay

## 2024-06-20 ENCOUNTER — Encounter: Payer: Self-pay | Admitting: Obstetrics and Gynecology

## 2024-06-20 DIAGNOSIS — N921 Excessive and frequent menstruation with irregular cycle: Secondary | ICD-10-CM

## 2024-06-20 MED ORDER — ESTRADIOL 0.5 MG PO TABS: 0.5000 mg | ORAL_TABLET | Freq: Every day | ORAL | 0 refills | Status: DC

## 2024-06-28 ENCOUNTER — Ambulatory Visit (INDEPENDENT_AMBULATORY_CARE_PROVIDER_SITE_OTHER): Admitting: Medical-Surgical

## 2024-06-28 ENCOUNTER — Encounter: Payer: Self-pay | Admitting: Medical-Surgical

## 2024-06-28 VITALS — BP 123/67 | HR 92 | Resp 20 | Ht 65.0 in | Wt 248.0 lb

## 2024-06-28 DIAGNOSIS — I10 Essential (primary) hypertension: Secondary | ICD-10-CM

## 2024-06-28 DIAGNOSIS — E1165 Type 2 diabetes mellitus with hyperglycemia: Secondary | ICD-10-CM

## 2024-06-28 DIAGNOSIS — Z7984 Long term (current) use of oral hypoglycemic drugs: Secondary | ICD-10-CM | POA: Diagnosis not present

## 2024-06-28 DIAGNOSIS — R300 Dysuria: Secondary | ICD-10-CM | POA: Diagnosis not present

## 2024-06-28 LAB — POCT URINALYSIS DIP (CLINITEK)
Bilirubin, UA: NEGATIVE
Blood, UA: NEGATIVE
Glucose, UA: 500 mg/dL — AB
Ketones, POC UA: NEGATIVE mg/dL
Leukocytes, UA: NEGATIVE
Nitrite, UA: NEGATIVE
POC PROTEIN,UA: 30 — AB
Spec Grav, UA: 1.02 (ref 1.010–1.025)
Urobilinogen, UA: 0.2 U/dL
pH, UA: 5.5 (ref 5.0–8.0)

## 2024-06-28 LAB — POCT GLYCOSYLATED HEMOGLOBIN (HGB A1C)
HbA1c, POC (controlled diabetic range): 10.1 % — AB (ref 0.0–7.0)
Hemoglobin A1C: 10.1 % — AB (ref 4.0–5.6)

## 2024-06-28 MED ORDER — PIOGLITAZONE HCL 30 MG PO TABS: 30.0000 mg | ORAL_TABLET | Freq: Every day | ORAL | 1 refills | Status: AC

## 2024-06-28 MED ORDER — ESOMEPRAZOLE MAGNESIUM 40 MG PO CPDR: 40.0000 mg | DELAYED_RELEASE_CAPSULE | Freq: Every day | ORAL | 3 refills | Status: AC

## 2024-06-28 NOTE — Assessment & Plan Note (Signed)
 Uncontrolled diabetes with A1c at 10.1%. Current regimen includes Actos  and glipizide . Metformin  discontinued due to intolerance. Multiple allergies/intolerances. Refuses to check sugars or use a CGM. Remains reluctant about injectables due to needle fear and concerns about gastroparesis and pancreatitis. - Discussed starting basal insulin (Toujeo, Tresiba, Lantus) for minimal GI side effects, still declined - Emphasized importance of blood sugar control to prevent complications. - Offered support for injection training and needle fear management. - Continue Glipizide  XL 20mg  daily. - Increasing Actos  to 30mg  daily.

## 2024-06-28 NOTE — Assessment & Plan Note (Signed)
 Intermittent dysuria and odor without systemic symptoms. - POCT UA + glucose and protein, - nitrites and leuks - Sending for culture. - Offered to do a wet prep, patient declined.

## 2024-06-28 NOTE — Progress Notes (Signed)
 Established patient visit   History of Present Illness   Discussed the use of AI scribe software for clinical note transcription with the patient, who gave verbal consent to proceed.  History of Present Illness   Lindsey Maxwell is a 48 year old female with uncontrolled diabetes who presents for follow-up.  Hyperglycemia and diabetes management - Hemoglobin A1c in May was elevated at 9.9% - Current medications: Actos  15 mg daily, glipizide  20 mg daily - Metformin  discontinued due to gastrointestinal side effects - Multiple allergies and intolerances to anti-diabetic medications - Hesitant about injectable therapies due to needle phobia and concerns about gastrointestinal side effects, especially with gastroparesis  Lower urinary tract symptoms - Burning and onoinyodor with urination for approximately 1.5 weeks - Occasional urinary frequency and urgency - No fever, chills, flank pain, or back pain  Preventive care - No recent eye examination - Has not received influenza or pneumococcal vaccines     Physical Exam   Physical Exam Vitals reviewed.  Constitutional:      General: She is not in acute distress.    Appearance: Normal appearance.  HENT:     Head: Normocephalic and atraumatic.  Cardiovascular:     Rate and Rhythm: Normal rate and regular rhythm.     Pulses: Normal pulses.     Heart sounds: Normal heart sounds. No murmur heard.    No friction rub. No gallop.  Pulmonary:     Effort: Pulmonary effort is normal. No respiratory distress.     Breath sounds: Normal breath sounds. No wheezing.  Skin:    General: Skin is warm and dry.  Neurological:     Mental Status: She is alert and oriented to person, place, and time.  Psychiatric:        Mood and Affect: Mood normal.        Behavior: Behavior normal.        Thought Content: Thought content normal.        Judgment: Judgment normal.    Assessment & Plan   Problem List Items Addressed This Visit        Cardiovascular and Mediastinum   Essential hypertension   BP stable on Valsartan -hydrochlorothiazide  320-25mg  daily. Not checking BP at home.  - Continue Valsartan -hydrochlorothiazide  as prescribed.         Endocrine   Uncontrolled type 2 diabetes mellitus with hyperglycemia, without long-term current use of insulin (HCC) - Primary   Uncontrolled diabetes with A1c at 10.1%. Current regimen includes Actos  and glipizide . Metformin  discontinued due to intolerance. Multiple allergies/intolerances. Refuses to check sugars or use a CGM. Remains reluctant about injectables due to needle fear and concerns about gastroparesis and pancreatitis. - Discussed starting basal insulin (Toujeo, Tresiba, Lantus) for minimal GI side effects, still declined - Emphasized importance of blood sugar control to prevent complications. - Offered support for injection training and needle fear management. - Continue Glipizide  XL 20mg  daily. - Increasing Actos  to 30mg  daily.       Relevant Medications   pioglitazone  (ACTOS ) 30 MG tablet   Other Relevant Orders   POCT HgB A1C (Completed)     Other   Dysuria   Intermittent dysuria and odor without systemic symptoms. - POCT UA + glucose and protein, - nitrites and leuks - Sending for culture. - Offered to do a wet prep, patient declined.      Relevant Orders   POCT URINALYSIS DIP (CLINITEK) (Completed)   Urine Culture   General Health  Maintenance Discussed pneumonia vaccination due to diabetes. Declined flu vaccine. - Offered pneumonia vaccine due to diabetes, declined.    Follow up   Return in about 3 months (around 09/27/2024) for DM follow up. __________________________________ Zada FREDRIK Palin, DNP, APRN, FNP-BC Primary Care and Sports Medicine [facility] [place]

## 2024-06-28 NOTE — Assessment & Plan Note (Signed)
 BP stable on Valsartan -hydrochlorothiazide  320-25mg  daily. Not checking BP at home.  - Continue Valsartan -hydrochlorothiazide  as prescribed.

## 2024-07-01 ENCOUNTER — Telehealth: Payer: Self-pay | Admitting: *Deleted

## 2024-07-01 ENCOUNTER — Ambulatory Visit: Payer: Self-pay | Admitting: Medical-Surgical

## 2024-07-01 LAB — URINE CULTURE

## 2024-07-01 MED ORDER — NITROFURANTOIN MONOHYD MACRO 100 MG PO CAPS: 100.0000 mg | ORAL_CAPSULE | Freq: Two times a day (BID) | ORAL | 0 refills | Status: AC

## 2024-07-01 NOTE — Telephone Encounter (Signed)
Left patient a message to call and schedule annual. 

## 2024-07-23 ENCOUNTER — Ambulatory Visit
Admission: RE | Admit: 2024-07-23 | Discharge: 2024-07-23 | Disposition: A | Discharge status: Home / Self Care | Attending: Family Medicine | Admitting: Family Medicine

## 2024-07-23 VITALS — BP 169/92 | HR 80 | Temp 98.1°F | Resp 13

## 2024-07-23 DIAGNOSIS — K645 Perianal venous thrombosis: Secondary | ICD-10-CM | POA: Diagnosis not present

## 2024-07-23 MED ORDER — HYDROCORT-PRAMOXINE (PERIANAL) 1-1 % EX FOAM: 1.0000 | Freq: Two times a day (BID) | CUTANEOUS | 0 refills | Status: AC

## 2024-07-23 NOTE — ED Provider Notes (Signed)
 [facility]    CSN: [id number] Arrival date & time: 07/23/24  1354      History   Chief Complaint Chief Complaint  Patient presents with   Rectal Pain    HPI Lindsey Maxwell is a 48 y.o. female.   HPI Very pleasant 48 year old female presents with rectal pain for 2 weeks worsening in intensity over the past 3 days.  Pain awaking her at night.  Patient denies rectal bleeding and endorses history of hemorrhoids.  Patient reports using OTC hydrocortisone  cream.  PMH significant for obesity, uncontrolled T2DM with hyperglycemia, HTN, and PCOS.  Past Medical History:  Diagnosis Date   Anemia    Anxiety    Bell's palsy    Diabetes mellitus without complication (HCC)    GERD (gastroesophageal reflux disease)    Hypercalcemia    Hypertension    Obesity    PCOS (polycystic ovarian syndrome)     Patient Active Problem List   Diagnosis Date Noted   Menorrhagia with irregular cycle 02/16/2023   Iron  deficiency anemia 11/07/2022   Constipation, chronic 09/14/2022   Uncontrolled type 2 diabetes mellitus with hyperglycemia, without long-term current use of insulin (HCC) 03/02/2022   Gastroparesis 03/02/2022   Dysuria 03/02/2022   History of anemia 03/18/2020   Morbid obesity (HCC) 09/14/2018   Influenza vaccination declined 09/14/2018   Muscle spasm 09/14/2018   Vitamin B 12 deficiency 01/22/2018   Vitamin D deficiency 01/22/2018   Tobacco use disorder 12/28/2016   Microalbuminuria 11/22/2016   Leukocytosis 11/22/2016   Thrombocytosis 11/22/2016   Neutrophilic leukocytosis 11/22/2016   Hyperlipidemia associated with type 2 diabetes mellitus (HCC) 03/19/2015   OSA (obstructive sleep apnea) 08/15/2014   Anxiety state 12/30/2013   Essential hypertension 12/30/2013   Gastroesophageal reflux disease without esophagitis 11/19/2013   Hirsutism 11/19/2013   Insomnia 11/19/2013   Polycystic ovarian syndrome 11/19/2013    Past Surgical History:  Procedure Laterality  Date   MOLE REMOVAL     on her back, benign   UPPER GASTROINTESTINAL ENDOSCOPY      OB History     Gravida  0   Para  0   Term  0   Preterm  0   AB  0   Living  0      SAB  0   IAB  0   Ectopic  0   Multiple  0   Live Births  0            Home Medications    Prior to Admission medications   Medication Sig Start Date End Date Taking? Authorizing Provider  hydrocortisone -pramoxine (PROCTOFOAM-HC) rectal foam Place 1 applicator rectally 2 (two) times daily for 7 days. 07/23/24 07/30/24 Yes Teddy Sharper, FNP  ALPRAZolam  (XANAX ) 0.5 MG tablet TAKE 1 TABLET BY MOUTH DAILY AS NEEDED FOR ANXIETY, TO BE USED SPARINGLY FOR SEVERE ANXIETY. NO MORE THAN 2 TO 3 TIMES WEEKLY. 03/07/24   Willo Mini, NP  clotrimazole -betamethasone  (LOTRISONE ) cream Apply 1 application. topically daily. 03/02/22   Willo Mini, NP  esomeprazole  (NEXIUM ) 40 MG capsule Take 1 capsule (40 mg total) by mouth daily at 12 noon. 06/28/24   Willo Mini, NP  estradiol  (ESTRACE ) 0.5 MG tablet Take 1 tablet (0.5 mg total) by mouth daily. 06/20/24   Cleatus Moccasin, MD  glipiZIDE  (GLUCOTROL  XL) 10 MG 24 hr tablet Take 2 tablets (20 mg total) by mouth daily with breakfast. 02/23/24   Willo Mini, NP  hydrocortisone  (ANUSOL -HC) 2.5 % rectal cream  Place 1 Application rectally 2 (two) times daily. 06/15/22   Federico Rosario BROCKS, MD  IBSRELA 50 MG TABS TAKE 1 TABLET BY MOUTH TWICE A DAY 10/18/23   Federico Rosario BROCKS, MD  meloxicam  (MOBIC ) 15 MG tablet Take 1 tablet (15 mg total) by mouth daily as needed for pain. 03/12/24   Willo Mini, NP  nitrofurantoin , macrocrystal-monohydrate, (MACROBID ) 100 MG capsule Take 1 capsule (100 mg total) by mouth 2 (two) times daily. 07/01/24   Willo Mini, NP  norethindrone  (MICRONOR ) 0.35 MG tablet Take 1 tablet by mouth daily.    [provider]  nystatin -triamcinolone  ointment (MYCOLOG) Apply 1 Application topically 2 (two) times daily as needed. 04/12/23   Cleatus Moccasin, MD  ondansetron   (ZOFRAN -ODT) 8 MG disintegrating tablet Take 1 tablet (8 mg total) by mouth every 8 (eight) hours as needed for nausea or vomiting. Patient taking differently: Take 8 mg by mouth as needed for nausea or vomiting. 04/24/23   Federico Rosario BROCKS, MD  pioglitazone  (ACTOS ) 30 MG tablet Take 1 tablet (30 mg total) by mouth daily. 06/28/24   Willo Mini, NP  rosuvastatin  (CRESTOR ) 20 MG tablet TAKE ONE TABLET BY MOUTH DAILY 02/23/24   Jessup, Joy, NP  valsartan -hydrochlorothiazide  (DIOVAN -HCT) 320-25 MG tablet Take 1 tablet by mouth daily. 02/23/24   Willo Mini, NP    Family History Family History  Problem Relation Age of Onset   Diabetes Mother    Colon polyps Mother    Irritable bowel syndrome Mother    Breast cancer Mother    Hypertension Father    Diabetes Father    Heart attack Brother 26   Diabetes Maternal Grandmother    Diabetes Maternal Grandfather    Diabetes Paternal Grandmother    Diabetes Paternal Grandfather    Colon cancer Neg Hx    Esophageal cancer Neg Hx    Rectal cancer Neg Hx    Stomach cancer Neg Hx     Social History Social History   Tobacco Use   Smoking status: Former    Current packs/day: 0.00    Types: Cigarettes    Quit date: 06/22/2011    Years since quitting: 13.0   Smokeless tobacco: Never  Vaping Use   Vaping status: Former  Substance Use Topics   Alcohol use: Yes    Comment: occasionally   Drug use: Never     Allergies   Sitagliptin, Amlodipine, Dapagliflozin, Losartan , Metformin , Metoprolol, Semaglutide , Venlafaxine, Atorvastatin, and Clonidine   Review of Systems Review of Systems   Physical Exam Triage Vital Signs ED Triage Vitals [07/23/24 1426]  Encounter Vitals Group     BP (!) 169/92     Girls Systolic BP Percentile      Girls Diastolic BP Percentile      Boys Systolic BP Percentile      Boys Diastolic BP Percentile      Pulse Rate 80     Resp 13     Temp 98.1 F (36.7 C)     Temp src      SpO2 98 %     Weight      Height       Head Circumference      Peak Flow      Pain Score 2     Pain Loc      Pain Education      Exclude from Growth Chart    No data found.  Updated Vital Signs BP (!) 169/92   Pulse 80  Temp 98.1 F (36.7 C)   Resp 13   LMP 07/16/2024 (Approximate)   SpO2 98%   Visual Acuity Right Eye Distance:   Left Eye Distance:   Bilateral Distance:    Right Eye Near:   Left Eye Near:    Bilateral Near:     Physical Exam Vitals and nursing note reviewed. Exam conducted with a chaperone present.  Constitutional:      Appearance: Normal appearance. She is obese.  HENT:     Head: Normocephalic and atraumatic.     Mouth/Throat:     Mouth: Mucous membranes are moist.     Pharynx: Oropharynx is clear.  Eyes:     Extraocular Movements: Extraocular movements intact.     Conjunctiva/sclera: Conjunctivae normal.     Pupils: Pupils are equal, round, and reactive to light.  Cardiovascular:     Rate and Rhythm: Normal rate and regular rhythm.     Pulses: Normal pulses.     Heart sounds: Normal heart sounds.  Pulmonary:     Effort: Pulmonary effort is normal.     Breath sounds: Normal breath sounds. No wheezing, rhonchi or rales.  Genitourinary:     Comments: ~0.5 cm x 0.5 cm circular shaped erythematous thrombosed hemorrhoid noted at 1 o'clock position   ~1.0 cm x 1.0 cm ovoid shaped erythematous thrombosed hemorrhoid noted at 8 o'clock position Musculoskeletal:        General: Normal range of motion.  Skin:    General: Skin is warm and dry.  Neurological:     General: No focal deficit present.     Mental Status: She is alert and oriented to person, place, and time. Mental status is at baseline.  Psychiatric:        Mood and Affect: Mood normal.        Behavior: Behavior normal.      UC Treatments / Results  Labs (all labs ordered are listed, but only abnormal results are displayed) Labs Reviewed - No data to display  EKG   Radiology No results  found.  Procedures Procedures (including critical care time)  Medications Ordered in UC Medications - No data to display  Initial Impression / Assessment and Plan / UC Course  I have reviewed the triage vital signs and the nursing notes.  Pertinent labs & imaging results that were available during my care of the patient were reviewed by me and considered in my medical decision making (see chart for details).     MDM: 1.  Thrombosed hemorrhoids-Rx'd Proctofoam HC rectal foam-Place 1 applicator rectally 2 times daily for 7 days. Advised patient patient to hold rectal cream while using Proctofoam.  Advised/encouraged patient to follow-up with general surgery for further evaluation and removal of thrombosed hemorrhoids.  Advised/encouraged increase in daily water intake to 64 ounces per day.  Contact information provided with his AVS.  Advised if symptoms worsen and/or unresolved please follow-up with general surgery above, PCP, or here for further evaluation.  Patient discharged home, hemodynamically stable. Final Clinical Impressions(s) / UC Diagnoses   Final diagnoses:  Thrombosed hemorrhoids     Discharge Instructions      Advised patient patient to hold rectal cream while using Proctofoam.  Advised/encouraged patient to follow-up with general surgery for further evaluation and removal of thrombosed hemorrhoids.  Advised/encouraged increase in daily water intake to 64 ounces per day.  Contact information provided with his AVS.  Advised if symptoms worsen and/or unresolved please follow-up with general surgery above, PCP,  or here for further evaluation.     ED Prescriptions     Medication Sig Dispense Auth. Provider   hydrocortisone -pramoxine (PROCTOFOAM-HC) rectal foam Place 1 applicator rectally 2 (two) times daily for 7 days. 10 g Corben Auzenne, FNP      PDMP not reviewed this encounter.   Teddy Sharper, FNP 07/23/24 1527

## 2024-07-23 NOTE — Discharge Instructions (Addendum)
 Advised patient patient to hold rectal cream while using Proctofoam.  Advised/encouraged patient to follow-up with general surgery for further evaluation and removal of thrombosed hemorrhoids.  Advised/encouraged increase in daily water intake to 64 ounces per day.  Contact information provided with his AVS.  Advised if symptoms worsen and/or unresolved please follow-up with general surgery above, PCP, or here for further evaluation.

## 2024-07-23 NOTE — ED Triage Notes (Signed)
 Pt presents to uc with co rectal pain for about 2 weeks with worsening intensity over the last few days. Pain awaking her at night. Denies any rectal bleeding. Endorses history of hemorrhoids. Has been using hydrocortisone  cream

## 2024-08-18 ENCOUNTER — Other Ambulatory Visit: Payer: Self-pay | Admitting: Medical-Surgical

## 2024-08-18 DIAGNOSIS — I10 Essential (primary) hypertension: Secondary | ICD-10-CM

## 2024-08-26 ENCOUNTER — Other Ambulatory Visit: Payer: Self-pay | Admitting: Medical-Surgical

## 2024-08-26 DIAGNOSIS — E1169 Type 2 diabetes mellitus with other specified complication: Secondary | ICD-10-CM

## 2024-08-27 ENCOUNTER — Other Ambulatory Visit: Payer: Self-pay | Admitting: Obstetrics and Gynecology

## 2024-08-27 ENCOUNTER — Other Ambulatory Visit: Payer: Self-pay | Admitting: Medical-Surgical

## 2024-08-27 DIAGNOSIS — F411 Generalized anxiety disorder: Secondary | ICD-10-CM

## 2024-08-27 DIAGNOSIS — N921 Excessive and frequent menstruation with irregular cycle: Secondary | ICD-10-CM

## 2024-08-28 NOTE — Telephone Encounter (Signed)
 Last filled 03/07/2024  Last OV 06/28/2024  Upcoming appointment 09/27/2024

## 2024-09-16 ENCOUNTER — Telehealth: Payer: Self-pay

## 2024-09-16 NOTE — Telephone Encounter (Addendum)
 RN attempted to call patient to verify current medications and if in need of refills, received electronic request from pharmacy for refill of Estradiol . RN left HIPAA compliant voicemail to return RN call.  Silvano LELON Piano, RN   Pt returned RN call. Pt reported has been spotting some being on both, can last up to a week, has had a couple days this month, has been on antibiotics too, not wanting to increase dose, has not been heavy bleeding. RN reported would let provider know and check on refill.  Silvano LELON Piano, RN

## 2024-09-18 ENCOUNTER — Other Ambulatory Visit: Payer: Self-pay

## 2024-09-18 DIAGNOSIS — N921 Excessive and frequent menstruation with irregular cycle: Secondary | ICD-10-CM

## 2024-09-18 MED ORDER — ESTRADIOL 0.5 MG PO TABS: 0.5000 mg | ORAL_TABLET | Freq: Every day | ORAL | 1 refills | Status: AC

## 2024-09-22 ENCOUNTER — Other Ambulatory Visit: Payer: Self-pay | Admitting: Medical-Surgical

## 2024-09-22 DIAGNOSIS — E1169 Type 2 diabetes mellitus with other specified complication: Secondary | ICD-10-CM

## 2024-09-22 DIAGNOSIS — I10 Essential (primary) hypertension: Secondary | ICD-10-CM

## 2024-09-27 ENCOUNTER — Ambulatory Visit: Admitting: Medical-Surgical

## 2024-10-20 ENCOUNTER — Other Ambulatory Visit: Payer: Self-pay | Admitting: Medical-Surgical

## 2024-10-20 DIAGNOSIS — I10 Essential (primary) hypertension: Secondary | ICD-10-CM

## 2024-10-20 DIAGNOSIS — E1169 Type 2 diabetes mellitus with other specified complication: Secondary | ICD-10-CM

## 2024-10-22 ENCOUNTER — Ambulatory Visit: Admitting: Medical-Surgical

## 2024-11-19 ENCOUNTER — Ambulatory Visit: Admitting: Medical-Surgical

## 2024-12-18 ENCOUNTER — Ambulatory Visit: Admitting: Medical-Surgical
